# Patient Record
Sex: Female | Born: 1945 | Marital: Married
Health system: Midwestern US, Community
[De-identification: ages and names within clinical notes are randomized; demographics above are authoritative.]

---

## 2012-02-06 IMAGING — DX KNEE 3 VIEWS RIGHT
1 series · 3 of 3 positions shown · non-contrast
Comparison: none

RIGHT KNEE, 02/06/12:
CLINICAL HISTORY: Pain.

[Series 1: pa axial · U · 0.14mm/px · 3 of 3 slices shown]
[im 1/3]
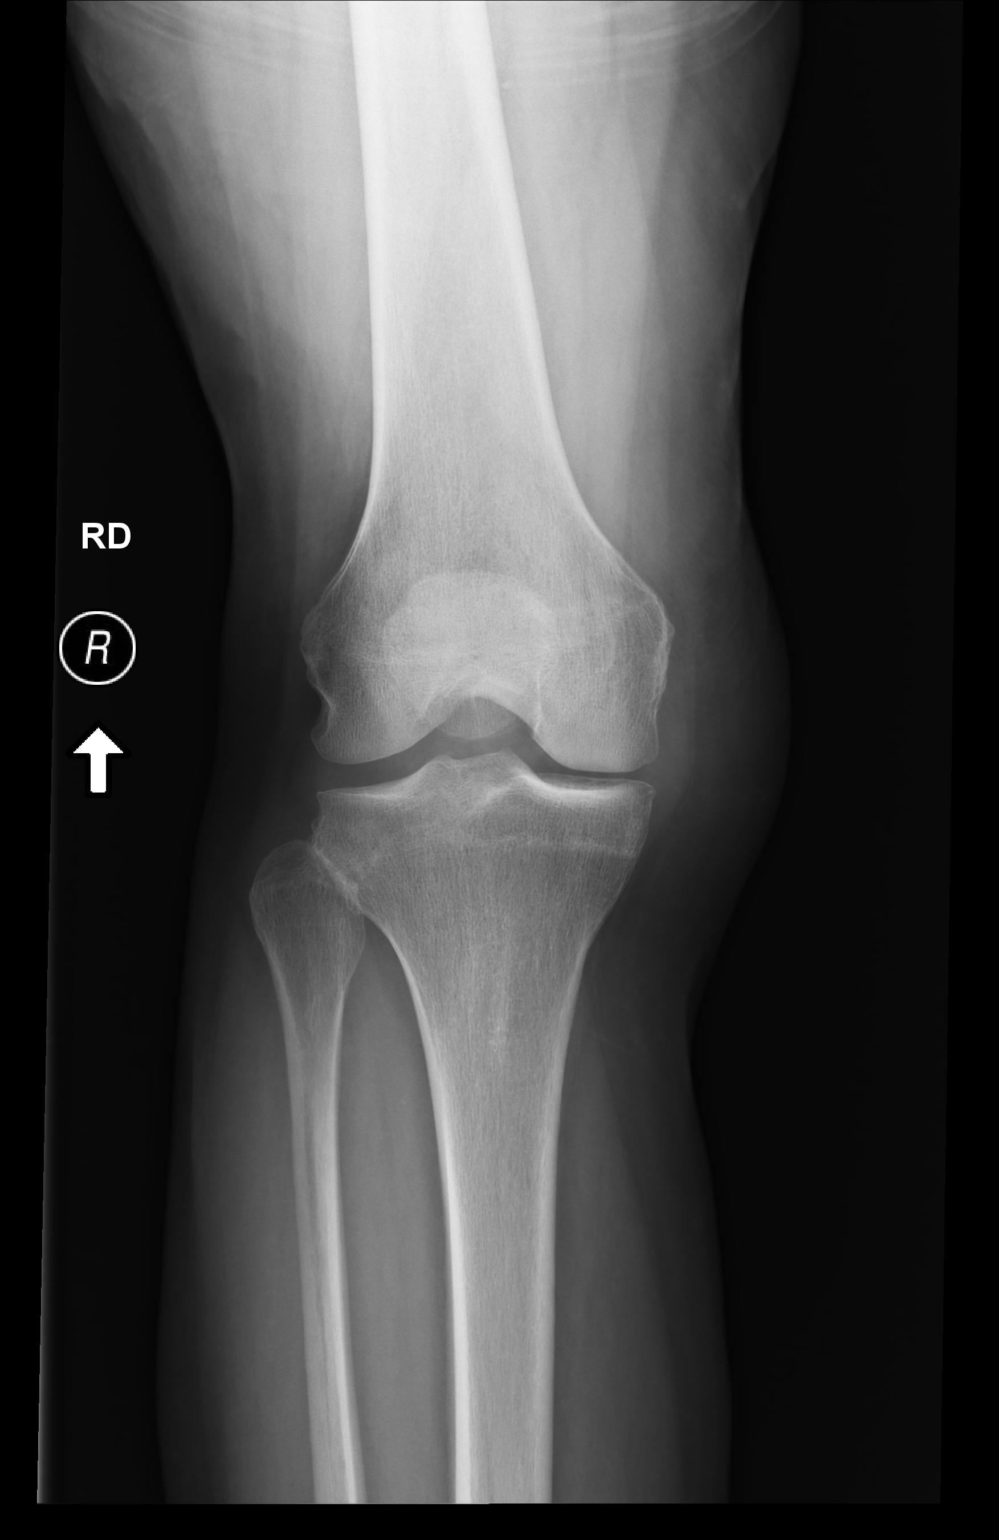
[im 2/3]
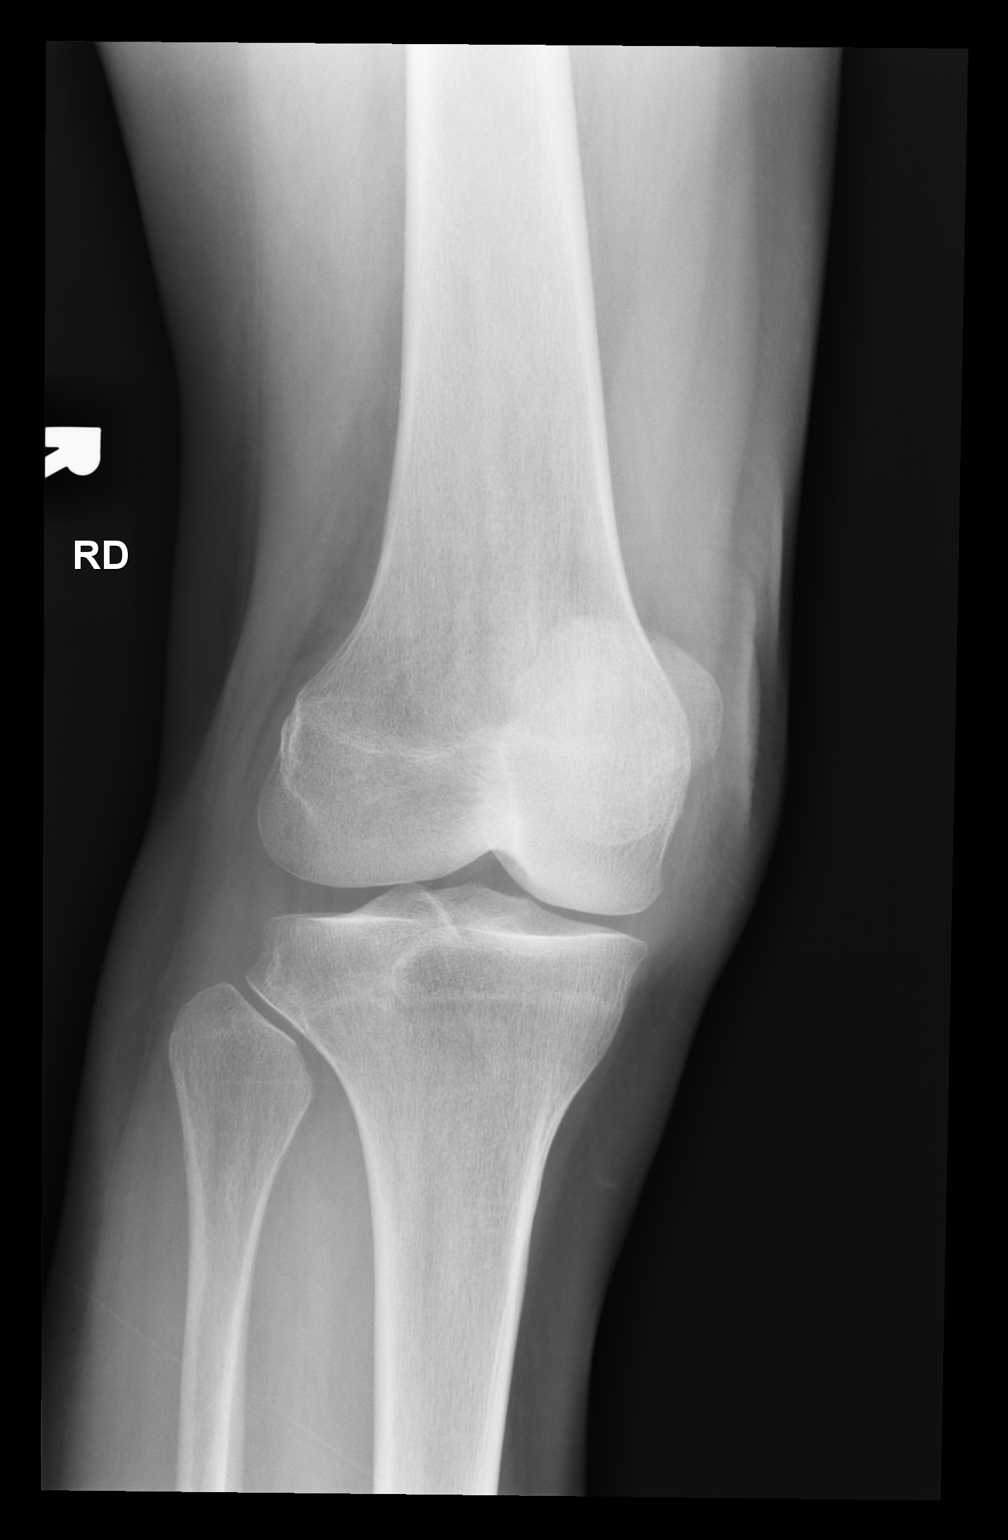
[im 3/3]
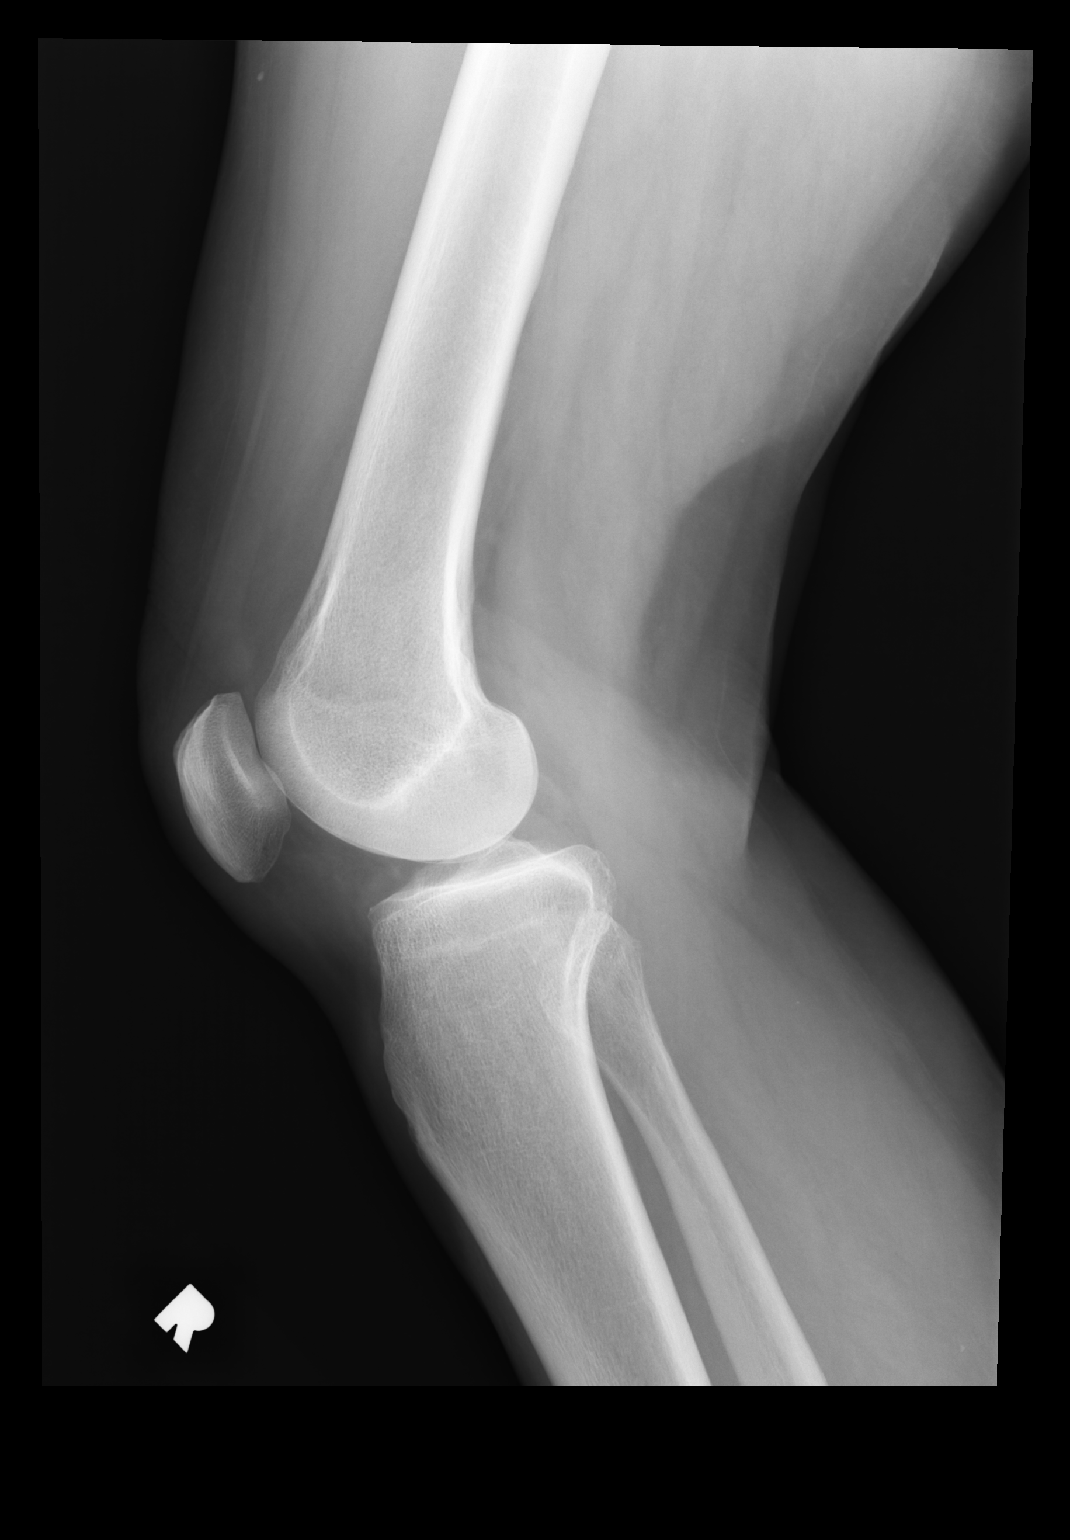

[3 of 3 positions shown; findings below may reference images not displayed]

FINDINGS: Three views of the right knee were obtained including
weightbearing AP film of the right knee.  Mild narrowing of the medial
joint space from minimal degenerative arthritis is present.  There are no
erosions or chondrocalcinosis.  There is no joint effusion.  Minor spurring
of the tibial spine is present.
CONCLUSION: Minimal degenerative arthritis in the right knee.

## 2013-01-20 IMAGING — DX KNEE 4 VIEWS LEFT
4 series · 4 of 4 positions shown · non-contrast
Comparison: none

KNEE 4 VIEWS LEFT, 01/20/2013 [DATE]:
HISTORY: Left knee pain and swelling for a couple of months.

[ap int rot]
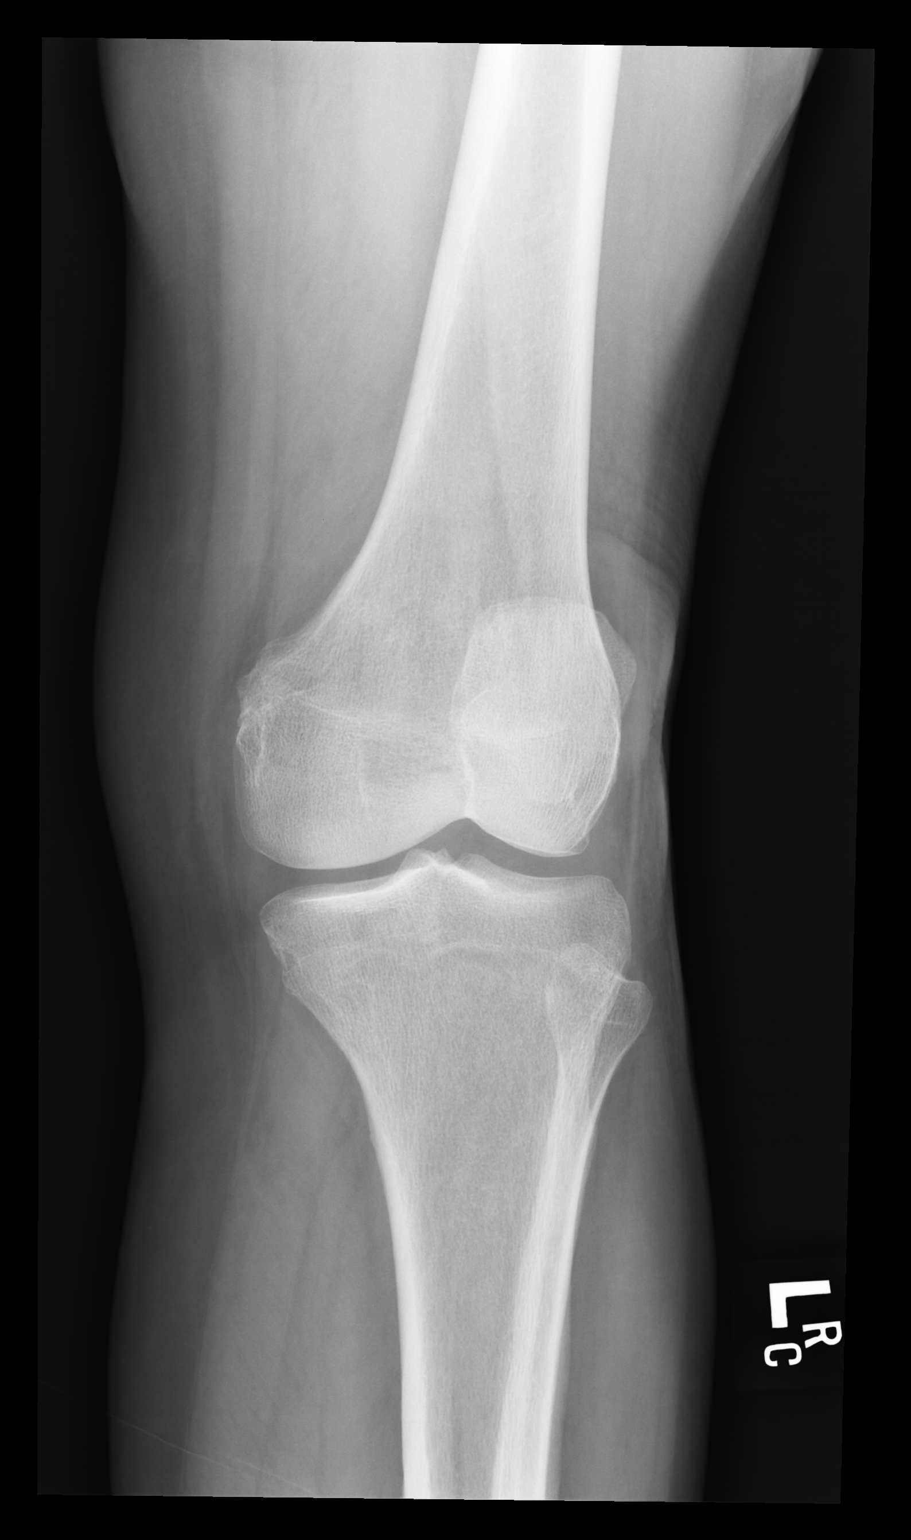

[ap ext rot]
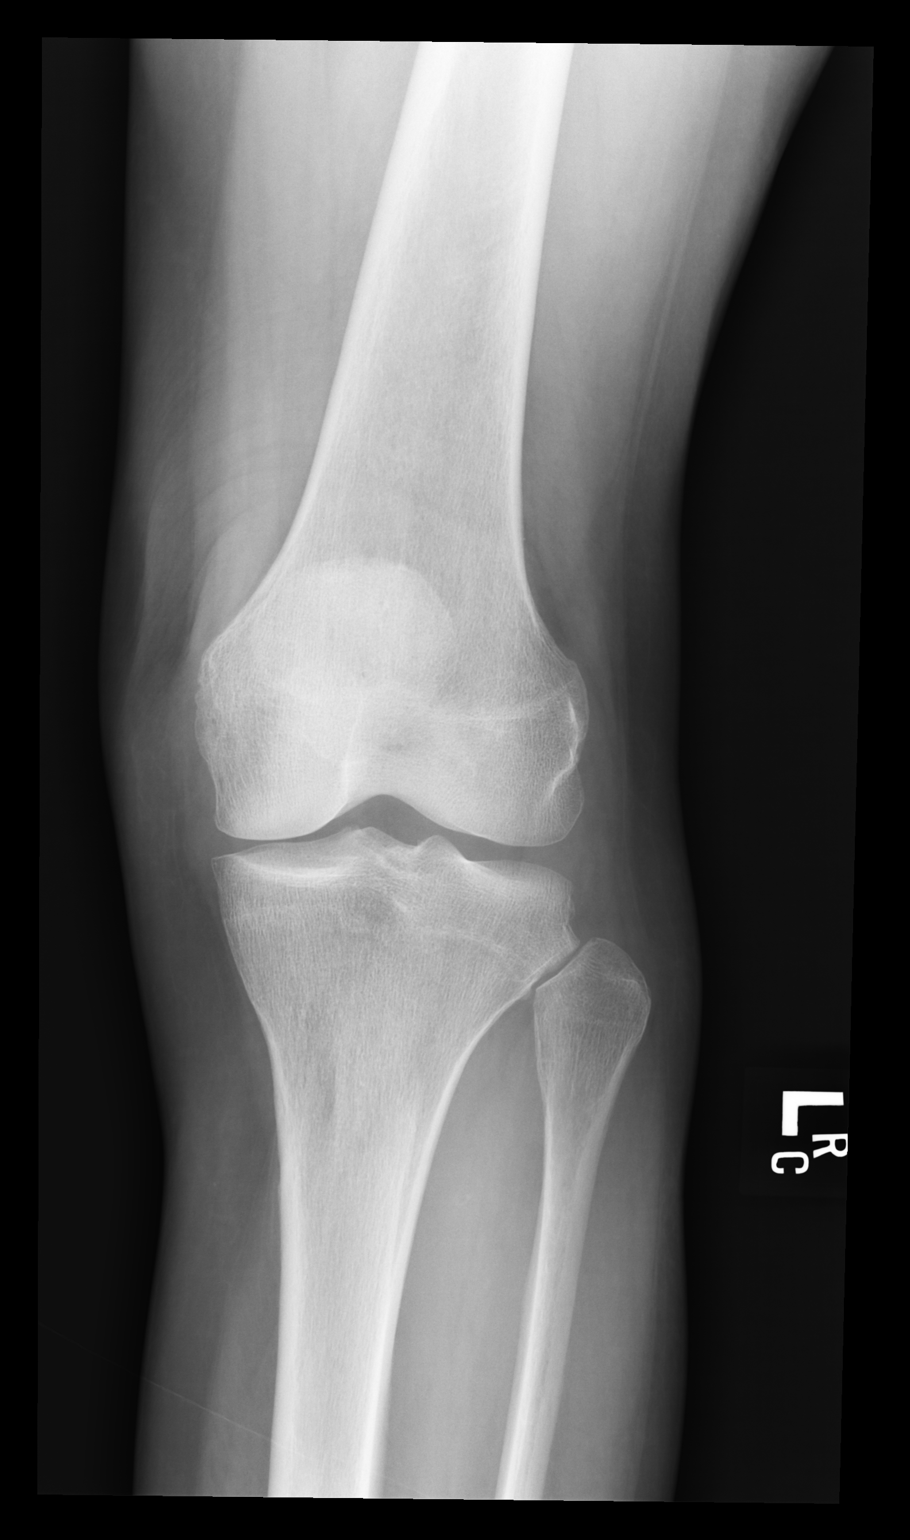

[AP]
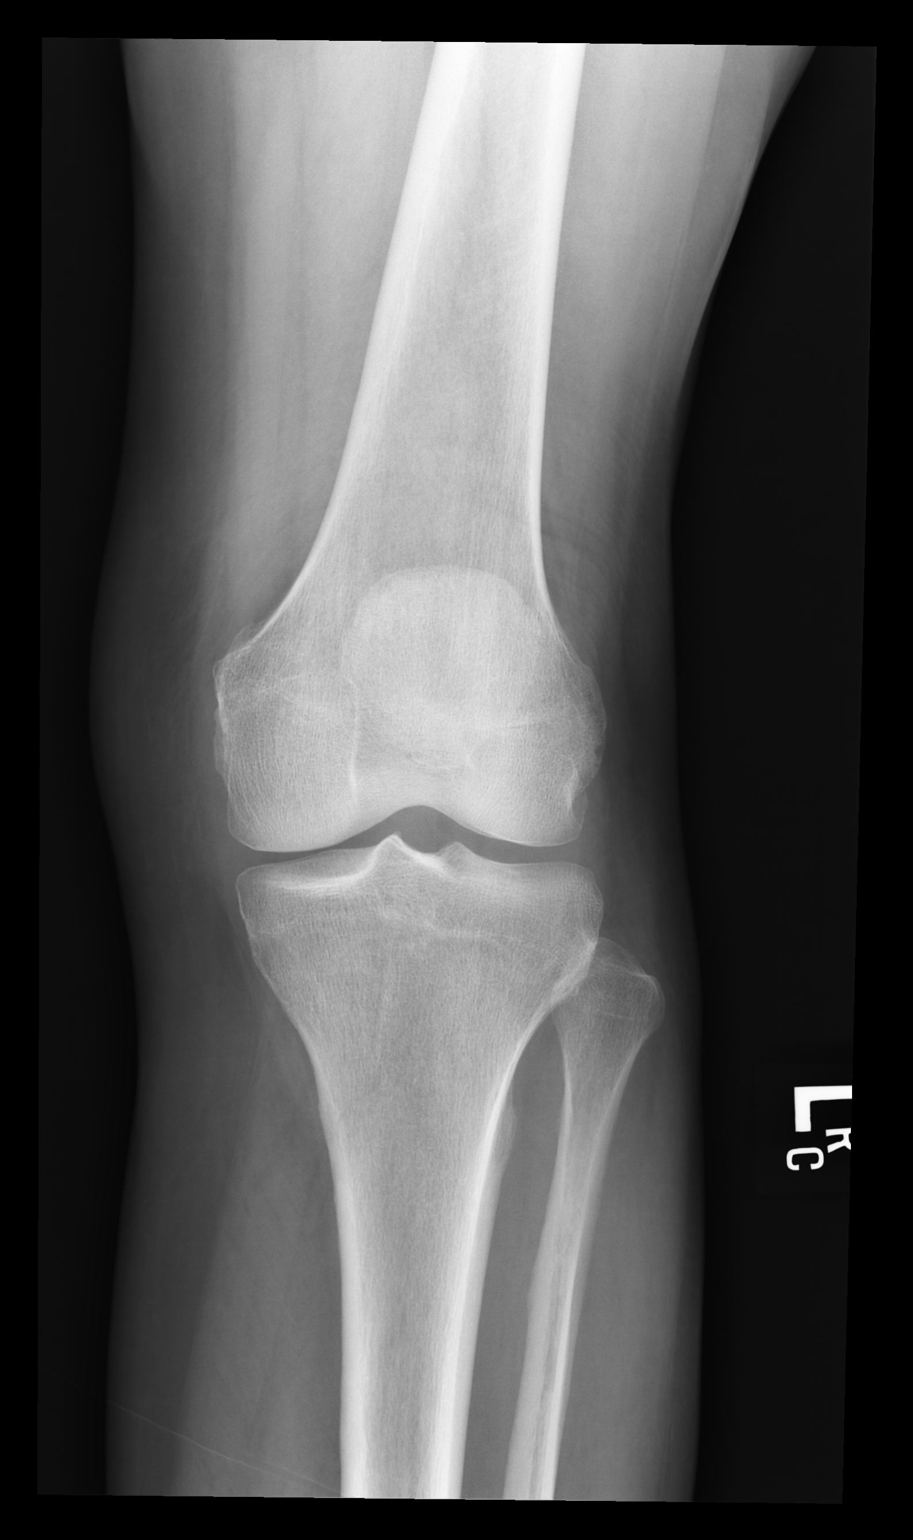

[lateral]
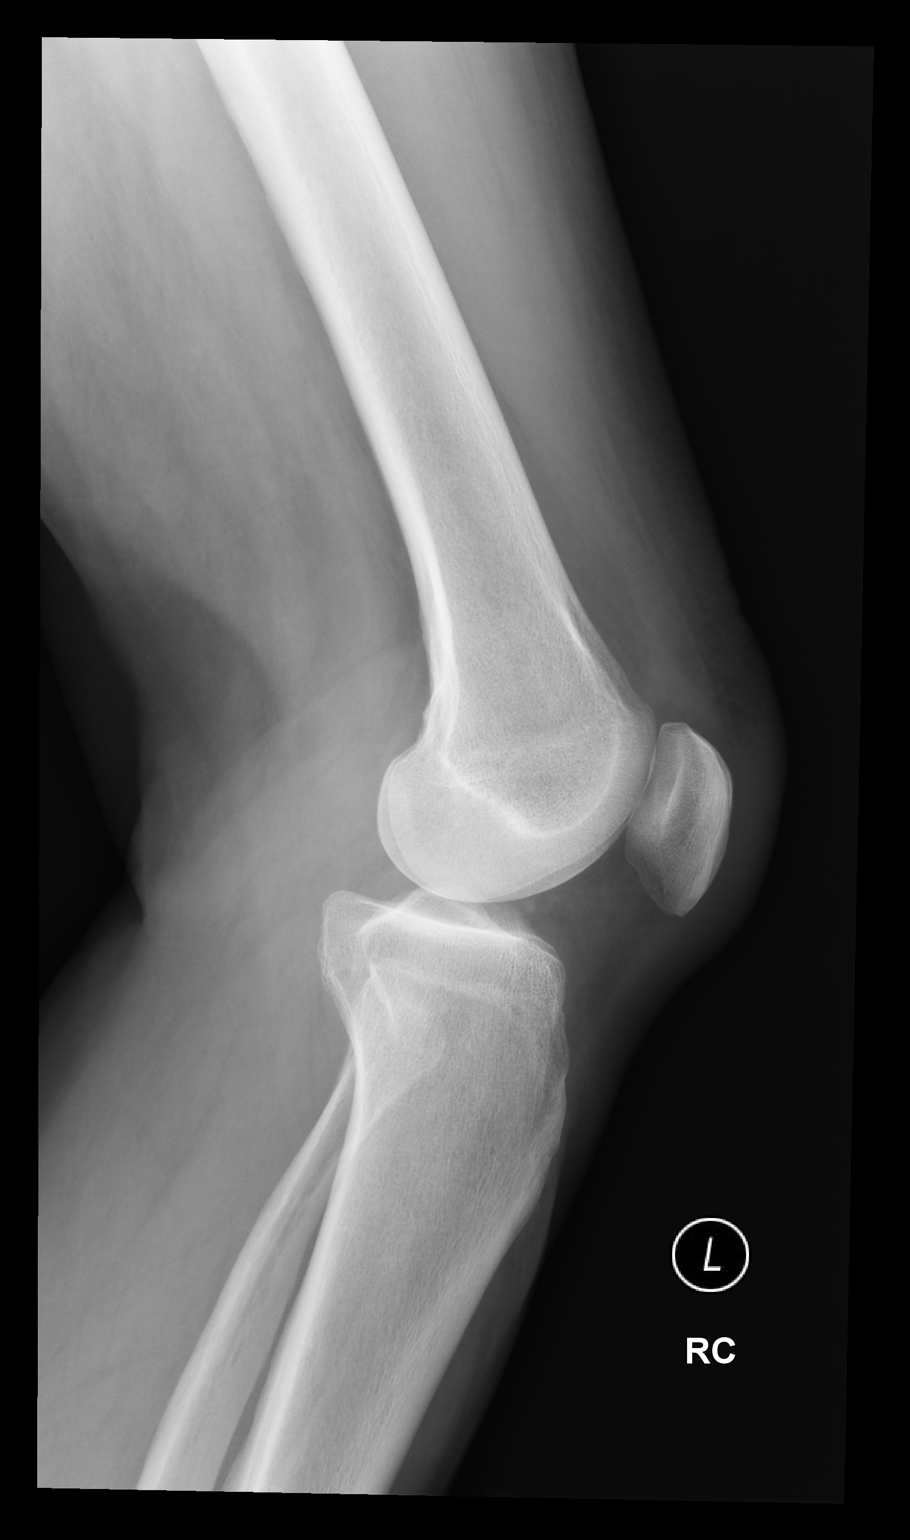

[4 of 4 positions shown; findings below may reference images not displayed]

FINDINGS: Trace suprapatellar effusion in the left knee. There is no evidence
of fracture or dislocation.  No soft tissue abnormalities.
IMPRESSION: Trace suprapatellar effusion the left knee with no fracture or dislocation
seen.
The medial and lateral compartments of the left knee are preserved.

## 2021-11-11 ENCOUNTER — Ambulatory Visit
Admit: 2021-11-11 | Discharge: 2021-11-11 | Payer: MEDICARE | Attending: Student in an Organized Health Care Education/Training Program

## 2021-11-11 DIAGNOSIS — J209 Acute bronchitis, unspecified: Secondary | ICD-10-CM

## 2021-11-11 LAB — POC COVID-19 & INFLUENZA COMBO (LIAT IN HOUSE)
INFLUENZA A: NOT DETECTED
INFLUENZA B: NOT DETECTED
SARS-CoV-2: NOT DETECTED

## 2021-11-11 MED ORDER — ALBUTEROL SULFATE HFA 108 (90 BASE) MCG/ACT IN AERS
108 (90 Base) MCG/ACT | RESPIRATORY_TRACT | 0 refills | Status: AC | PRN
Start: 2021-11-11 — End: ?

## 2021-11-11 MED ORDER — PREDNISONE 20 MG PO TABS
20 MG | ORAL_TABLET | Freq: Every day | ORAL | 0 refills | Status: AC
Start: 2021-11-11 — End: 2021-11-16

## 2021-11-11 MED ORDER — BENZONATATE 100 MG PO CAPS
100 MG | ORAL_CAPSULE | Freq: Three times a day (TID) | ORAL | 0 refills | Status: AC | PRN
Start: 2021-11-11 — End: 2021-11-18

## 2021-11-11 NOTE — Patient Instructions (Signed)
Patient made aware of negative COVID and flu results. We discussed s/s are consistent with acute bronchitis secondary to viral URI and antibiotics are not indicated at this time. We discussed viruses are self-limiting and treatment is focused on symptom management. Advised will prescribe steroid, prednisone, to take once a day for 5 days for bronchitis. Advised will prescribe benzonatate to take 3 times a day as needed for cough. Advised will prescribe albuterol inhaler to use every 4 hours as needed for shortness of breath and/or wheezing. Advised may take OTC plain mucinex as directed for congestion. We discussed importance of adequate hydration. Advised to follow-up at an urgent care in [place] if symptoms worsen or do not improve with prednisone.

## 2021-11-11 NOTE — Progress Notes (Signed)
Tamara Kennedy (DOB:  1945/04/25) is a 76 y.o. female,here for evaluation of the following chief complaint(s):  Cough          ASSESSMENT/PLAN:  1. Acute bronchitis, unspecified organism  -     predniSONE (DELTASONE) 20 MG tablet; Take 2 tablets by mouth daily for 5 days, Disp-10 tablet, R-0Normal  -     albuterol sulfate HFA (VENTOLIN HFA) 108 (90 Base) MCG/ACT inhaler; Inhale 2 puffs into the lungs every 4 hours as needed for Wheezing or Shortness of Breath, Disp-1 each, R-0Normal  2. Acute cough  -     POC COVID-19 & Influenza Combo ([facility] in House)  -     benzonatate (TESSALON) 100 MG capsule; Take 1 capsule by mouth 3 times daily as needed for Cough, Disp-21 capsule, R-0Normal  3. Viral upper respiratory tract infection      Patient made aware of negative COVID and flu results. We discussed s/s are consistent with acute bronchitis secondary to viral URI and antibiotics are not indicated at this time. We discussed viruses are self-limiting and treatment is focused on symptom management. Advised will prescribe steroid, prednisone, to take once a day for 5 days for bronchitis. Advised will prescribe benzonatate to take 3 times a day as needed for cough. Advised will prescribe albuterol inhaler to use every 4 hours as needed for shortness of breath and/or wheezing. Advised may take OTC plain mucinex as directed for congestion. We discussed importance of adequate hydration. Advised to follow-up at an urgent care in [place] if symptoms worsen or do not improve with prednisone.         HPI:  Patient traveling to [place] from [place] presents with c/o congestion, productive cough x 3 days. Denies sick contacts. Denies history of asthma, COPD or other respiratory problems. Denies fever, body aches, chills, fatigue, malaise, N/V/D, sore throat, shortness of breath, wheezing, rash, loss of taste or smell and any other symptoms.              Review of Systems   HENT:  Positive for congestion.    Respiratory:  Positive  for cough.    All other systems reviewed and are negative.        VITALS:  Vitals:    11/11/21 1248   BP: 129/76   Site: Right Upper Arm   Position: Sitting   Cuff Size: Large Adult   Pulse: 85   Resp: 16   Temp: 98.4 F (36.9 C)   TempSrc: Oral   SpO2: 97%   Weight: 80.5 kg (177 lb 8 oz)           MEDICATIONS:  Current Outpatient Medications   Medication Sig Dispense Refill    losartan-hydroCHLOROthiazide (HYZAAR) 100-12.5 MG per tablet       verapamil (CALAN SR) 180 MG extended release tablet       atorvastatin (LIPITOR) 40 MG tablet       escitalopram (LEXAPRO) 10 MG tablet Take by mouth      aspirin 81 MG EC tablet Take by mouth      Calcium Carbonate-Vit D-Min (CALCIUM 1200) 1200-1000 MG-UNIT CHEW Take by mouth      amLODIPine-atorvastatatin (CADUET) 10-20 MG per tablet Take 1 tablet by mouth daily      predniSONE (DELTASONE) 20 MG tablet Take 2 tablets by mouth daily for 5 days 10 tablet 0    benzonatate (TESSALON) 100 MG capsule Take 1 capsule by mouth 3 times daily as needed for Cough 21  capsule 0    albuterol sulfate HFA (VENTOLIN HFA) 108 (90 Base) MCG/ACT inhaler Inhale 2 puffs into the lungs every 4 hours as needed for Wheezing or Shortness of Breath 1 each 0     No current facility-administered medications for this visit.               There is no problem list on file for this patient.      No past surgical history on file.         ALLERGIES:  Allergies   Allergen Reactions    Clindamycin Rash    Hydrocodone Hives    Tetracycline      Other reaction(s): Phototoxic drug reaction    Codeine             Physical Exam  Vitals reviewed.   Constitutional:       General: She is awake. She is not in acute distress.     Appearance: Normal appearance. She is well-developed.   HENT:      Mouth/Throat:      Lips: Pink.      Mouth: Mucous membranes are moist.   Cardiovascular:      Rate and Rhythm: Normal rate and regular rhythm.      Heart sounds: Normal heart sounds, S1 normal and S2 normal. No murmur heard.      No friction rub. No gallop.   Pulmonary:      Effort: Pulmonary effort is normal. No respiratory distress.      Breath sounds: Normal breath sounds and air entry.   Skin:     General: Skin is warm and dry.      Capillary Refill: Capillary refill takes less than 2 seconds.      Findings: No lesion or rash.   Neurological:      Mental Status: She is alert and oriented to person, place, and time.   Psychiatric:         Behavior: Behavior is cooperative.               An electronic signature was used to authenticate this note.    --Ilean China, DNP, FNP-C

## 2021-12-07 IMAGING — MG MAMMOGRAPHY SCREENING BILATERAL 3[PERSON_NAME]
8 series · 8 of 24 positions shown · non-contrast
Comparison: Comparison was made to prior examinations.

________________________________________________________________________________________________ 
MAMMOGRAPHY SCREENING BILATERAL 3JUNRIC HOMERES, 12/07/2021 [DATE]: 
CLINICAL INDICATION: Encounter for screening mammogram.
TECHNIQUE: Digital bilateral mammograms and 3-D Tomosynthesis were obtained. 
These were interpreted both primarily and with the aid of computer-aided 
detection system.  
BREAST DENSITY: (Level B) There are scattered areas of fibroglandular density.

[R MLO]
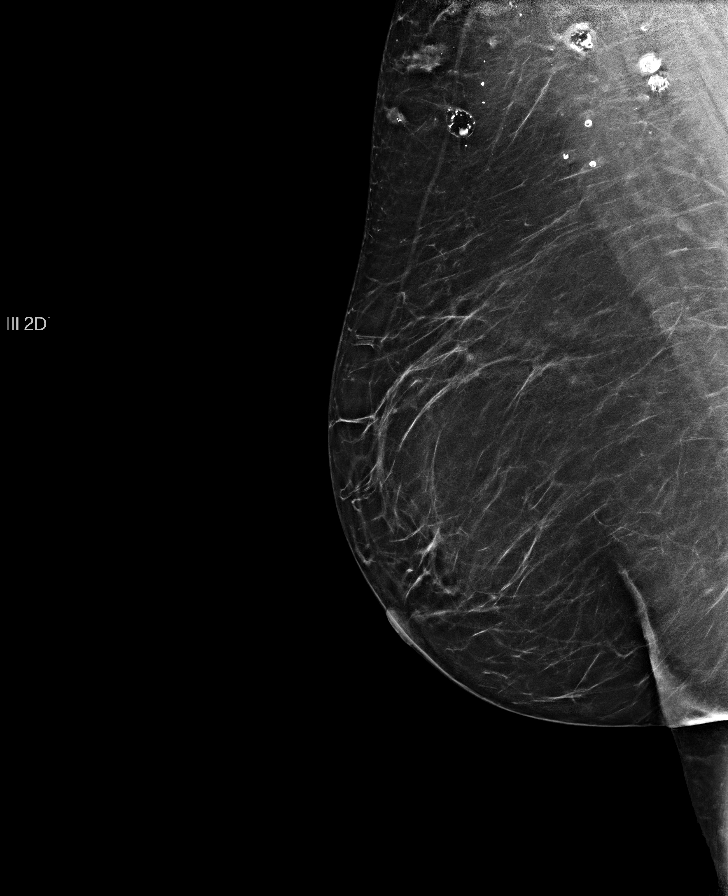

[L MLO]
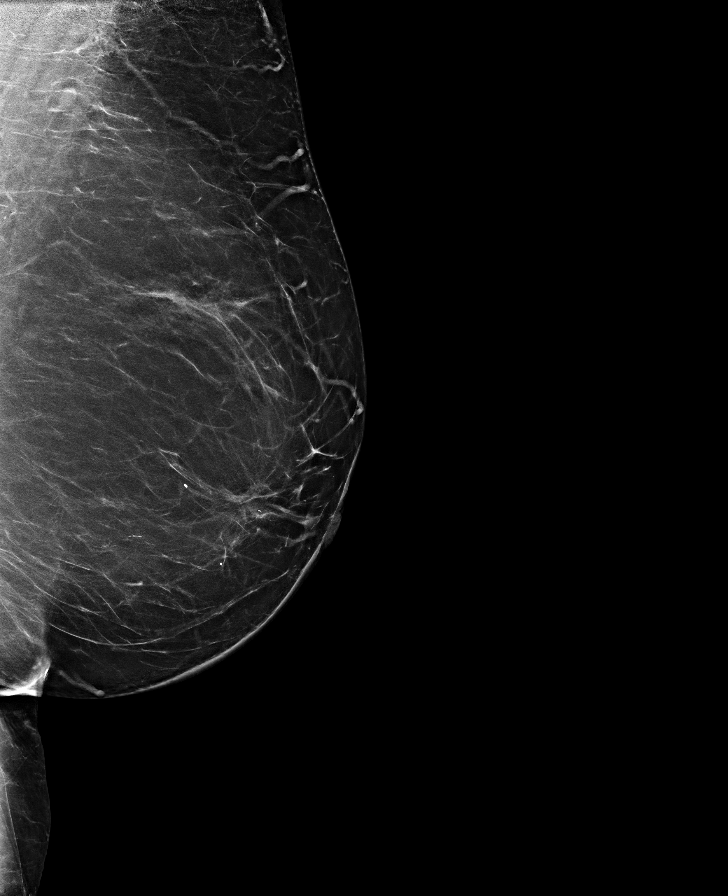

[L CC]
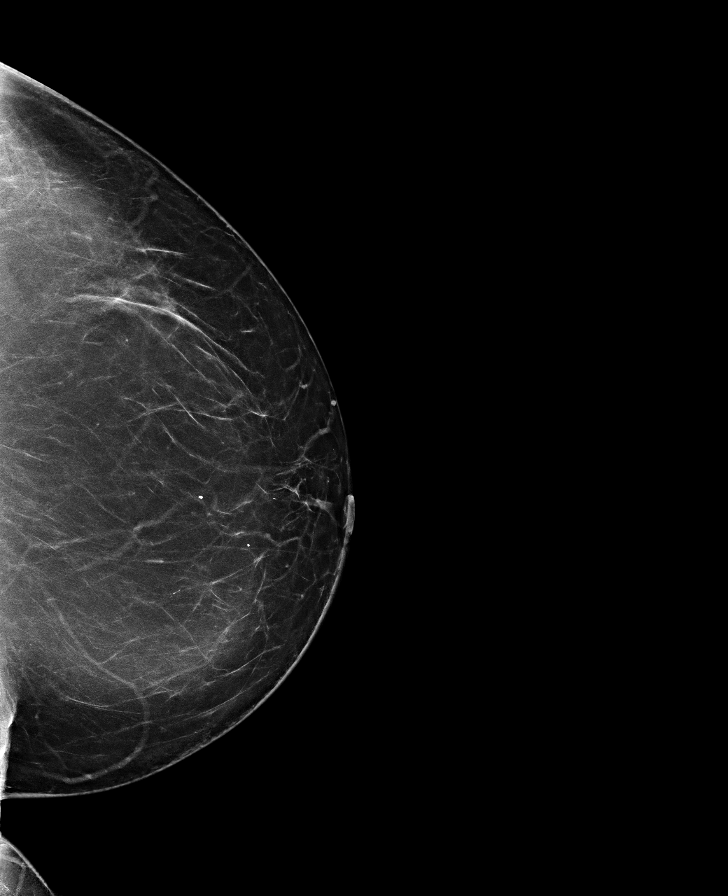

[R CC]
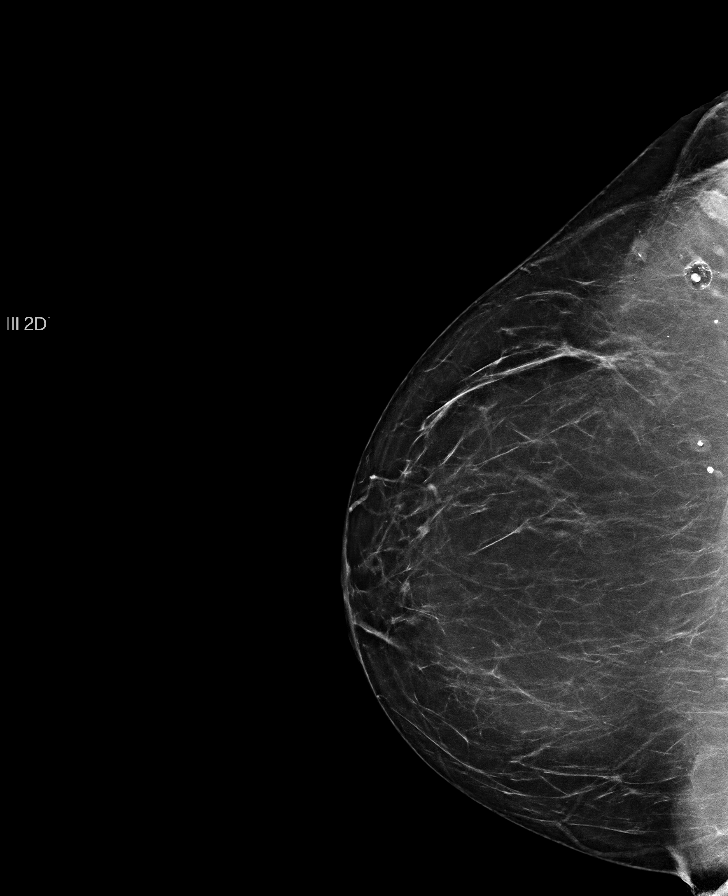

[L CC tomo · tomo slice 47/92.0]
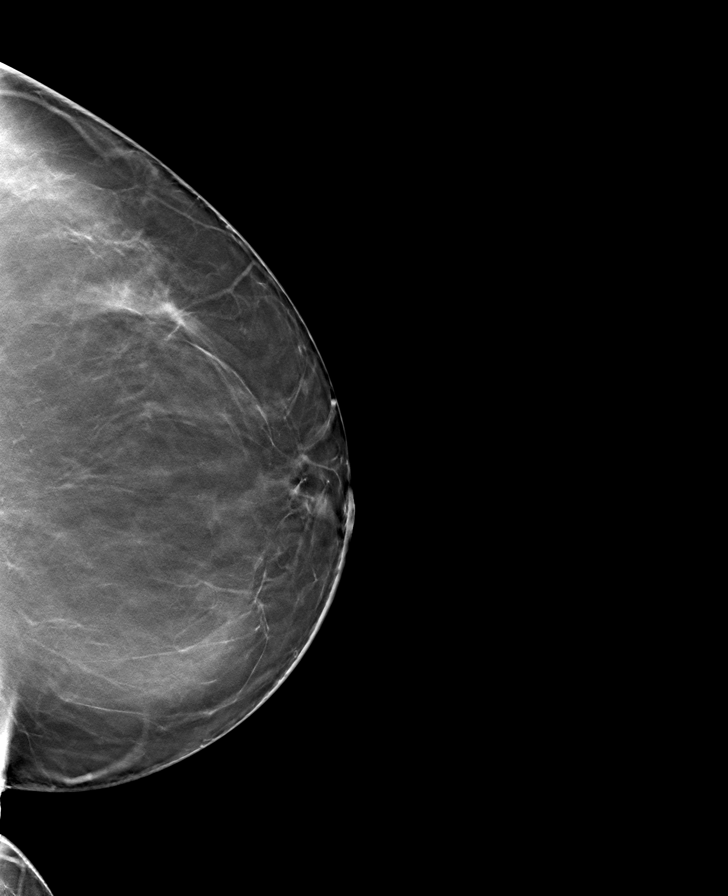

[R CC tomo · tomo slice 45/90.0]
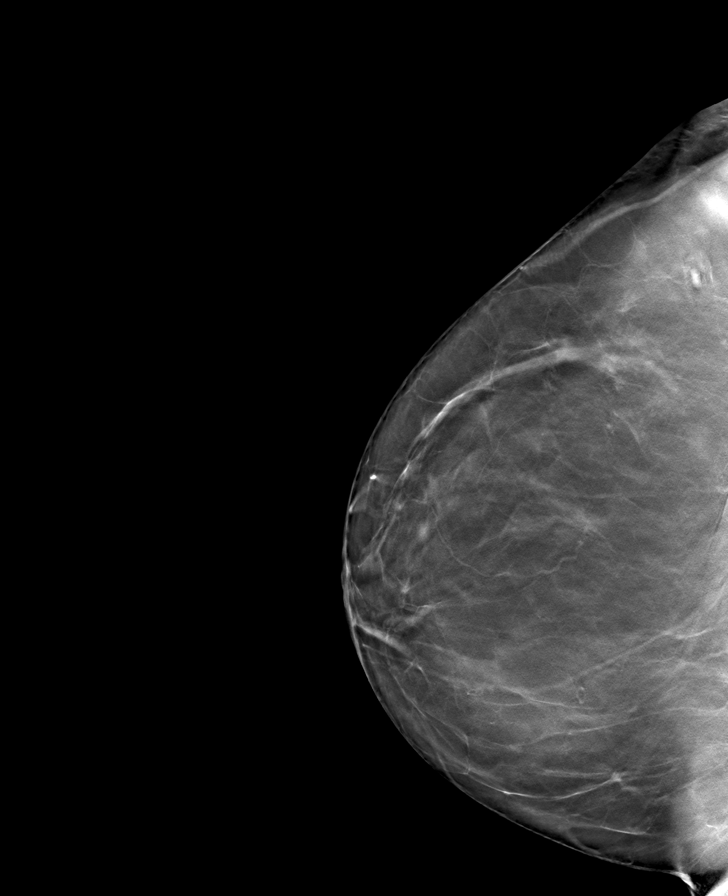

[L MLO tomo · tomo slice 46/91.0]
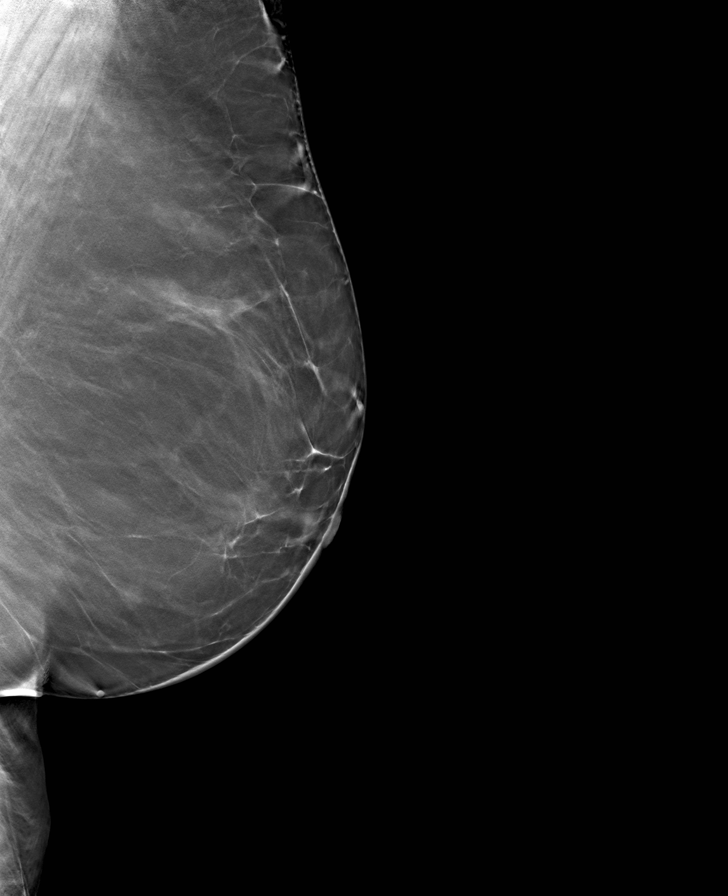

[R MLO tomo · tomo slice 45/90.0]
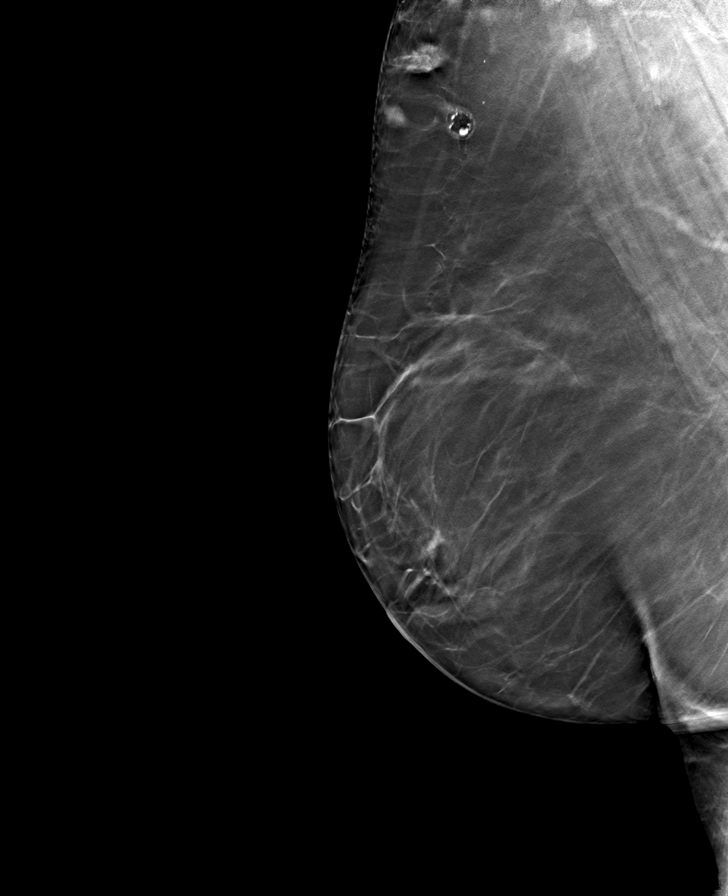

[8 of 24 positions shown; findings below may reference images not displayed]

FINDINGS: Stable calcified injection granulomas in the right breast and right 
axillary region. Possible developing architectural distortion in the 
retroareolar region of the left breast seen only the MLO projection will bring 
the patient for spot compression and rolled views of the left breast in MLO 
projection and possible limited left breast ultrasound for further evaluation. 
No dominant mass in the right breast and no suspicious clusters of 
microcalcifications in either breast.
IMPRESSION: Possible developing architectural distortion in the retroareolar region of the 
left breast seen only the MLO projection will bring the patient for spot 
compression and rolled views of the left breast in MLO projection and possible 
limited left breast ultrasound for further evaluation.  
(BI-RADS 0) Incomplete. Further evaluation will be performed as discussed above 
and the results will be reported separately.

## 2021-12-23 IMAGING — MG MAMMOGRAPHY DIAGNOSTIC LEFT 3D TOMOSYNTH
8 series · 8 of 24 positions shown · non-contrast
Comparison: Comparison was made to prior examinations.

________________________________________________________________________________________________ 
MAMMOGRAPHY DIAGNOSTIC LEFT 3D TOMOSYNTH, LEFT BREAST ULTRASOUND UNILATERAL 
LIMITED, 12/23/2021 [DATE]: 
CLINICAL INDICATION: Patient with possible developing architectural distortion 
in the retroareolar region of the left breast seen on MLO view only.
TECHNIQUE: Unilateral left breast digital mammography and 3-D Tomosynthesis were 
obtained. In addition, computer-aided detection was utilized. Limited Left 
breast ultrasound demonstrates. 
BREAST DENSITY: (Level B) There are scattered areas of fibroglandular density.

[L ML]
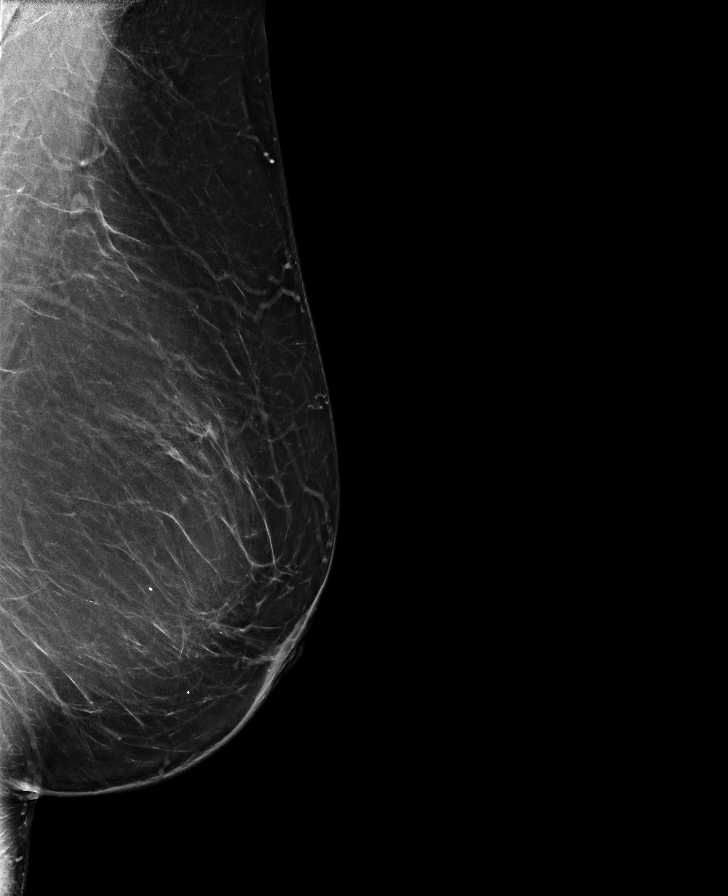

[L MLO (1 of 3)]
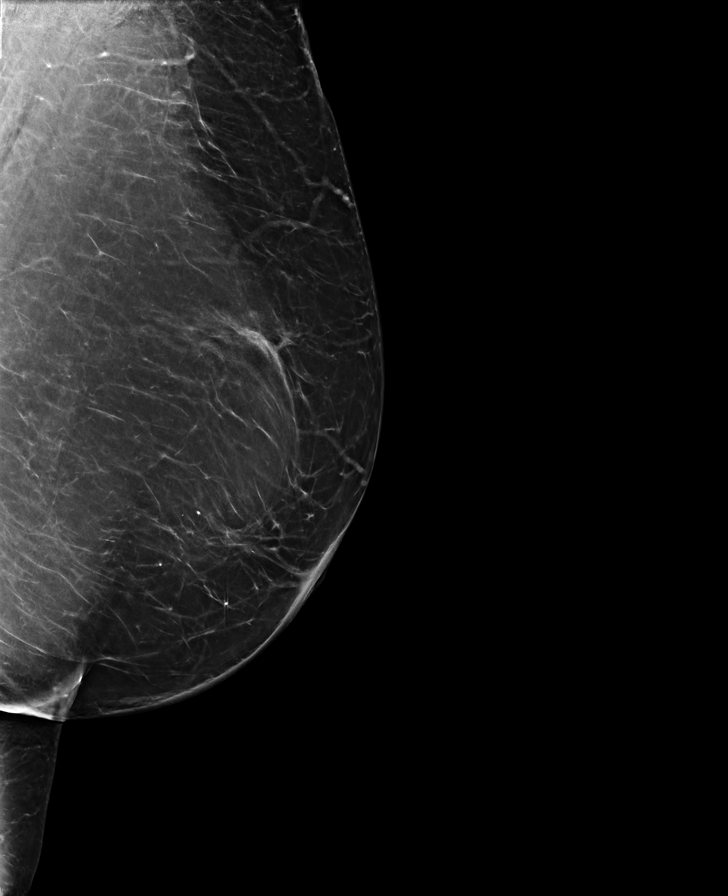

[L MLO (2 of 3)]
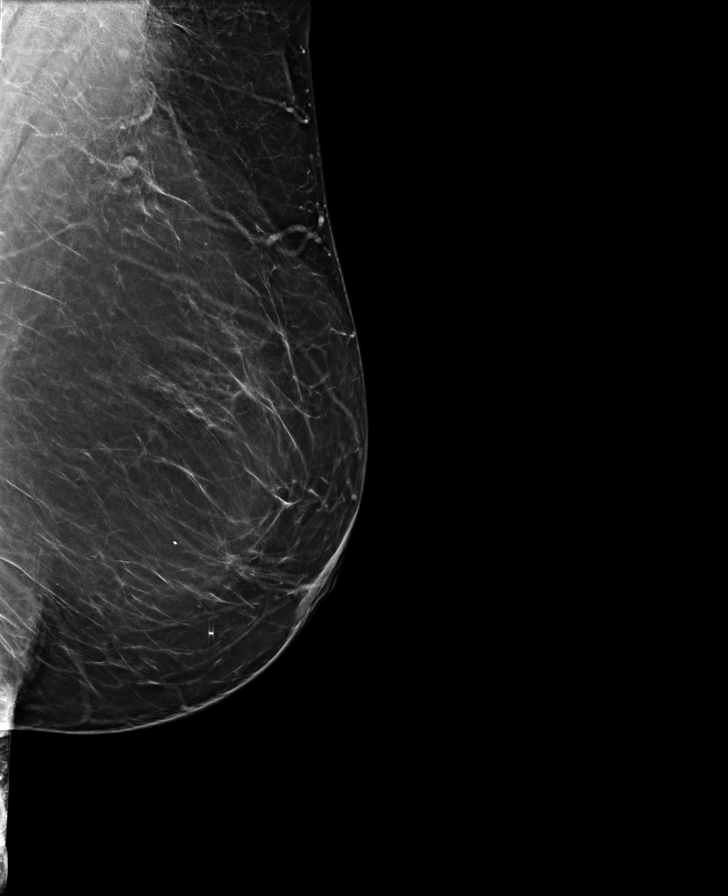

[L MLO (3 of 3)]
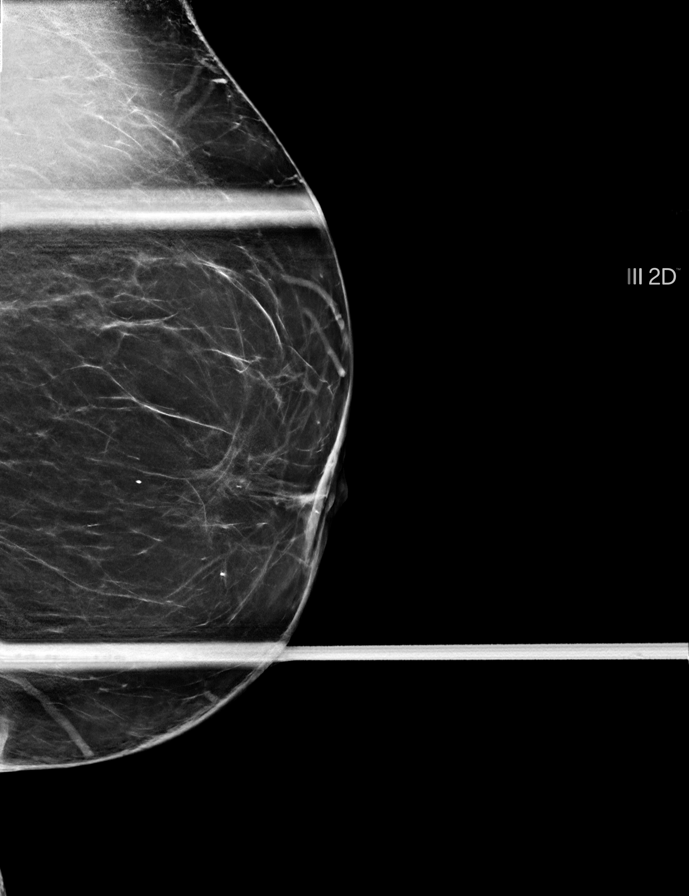

[L MLO tomo (1 of 3) · tomo slice 39/78.0]
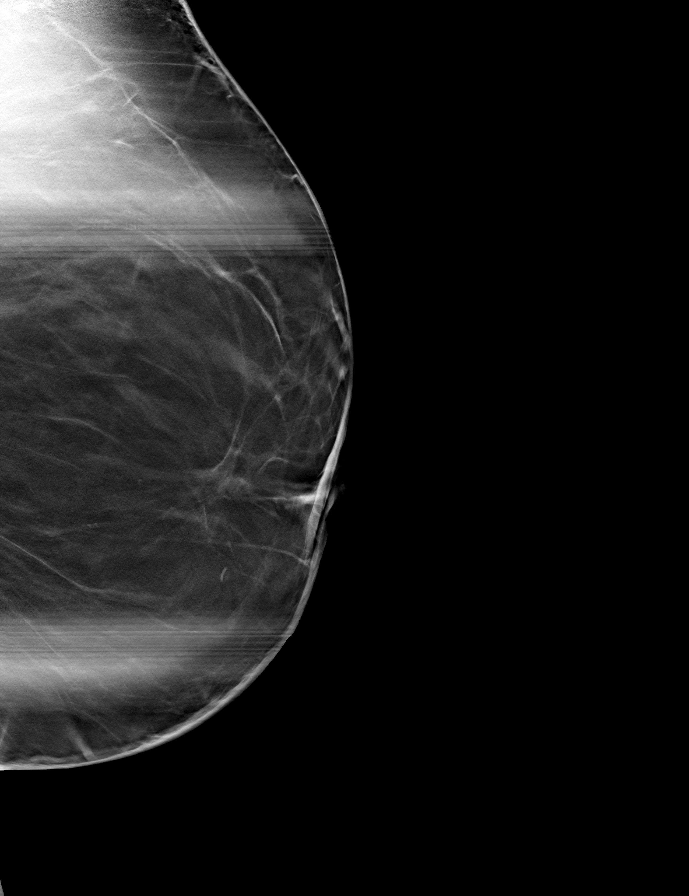

[L MLO tomo (2 of 3) · tomo slice 49/97.0]
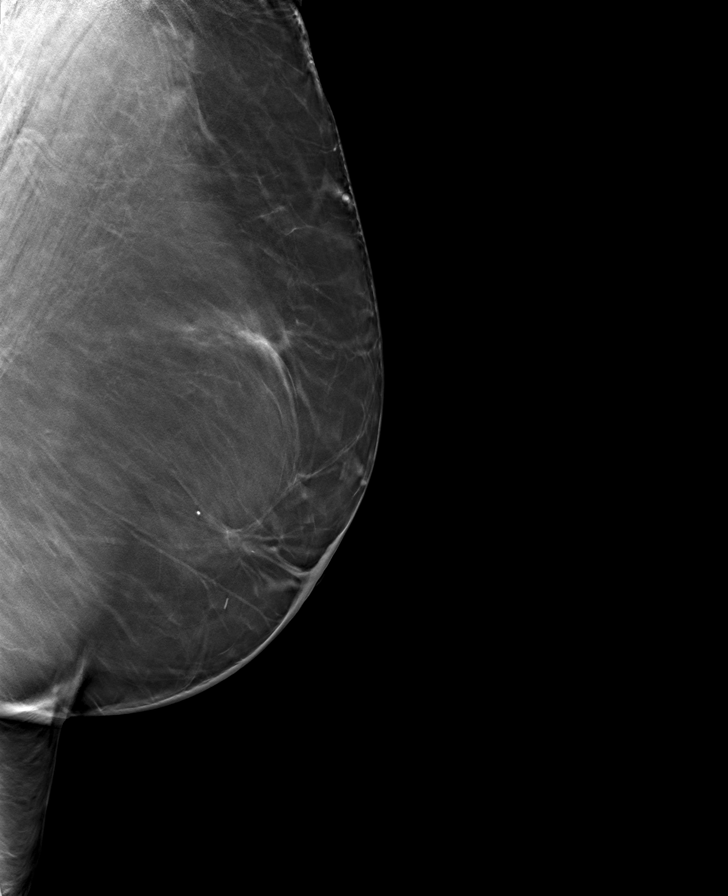

[L MLO tomo (3 of 3) · tomo slice 47/92.0]
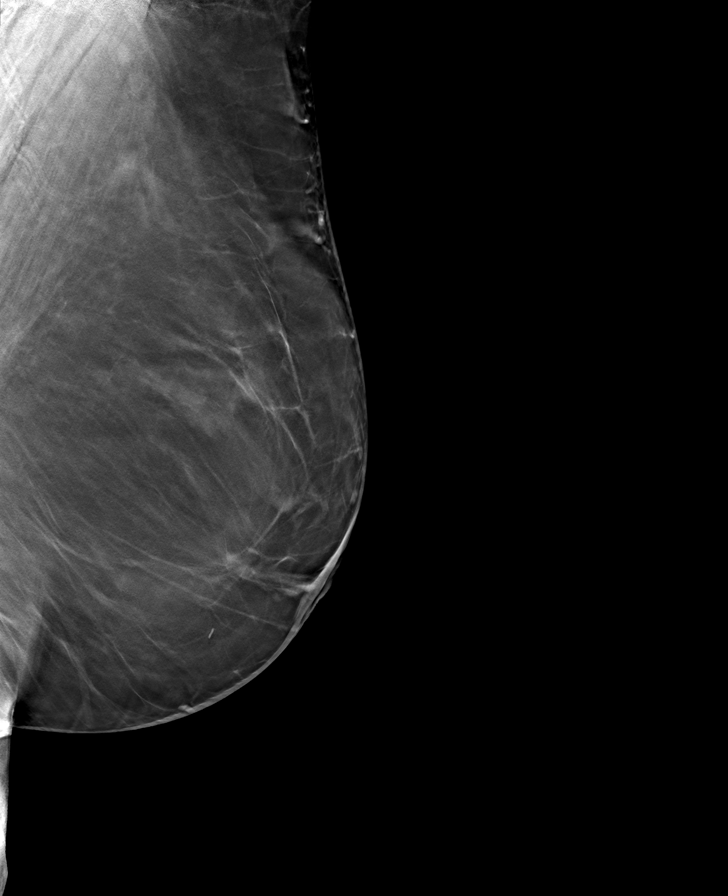

[L ML tomo · tomo slice 49/96.0]
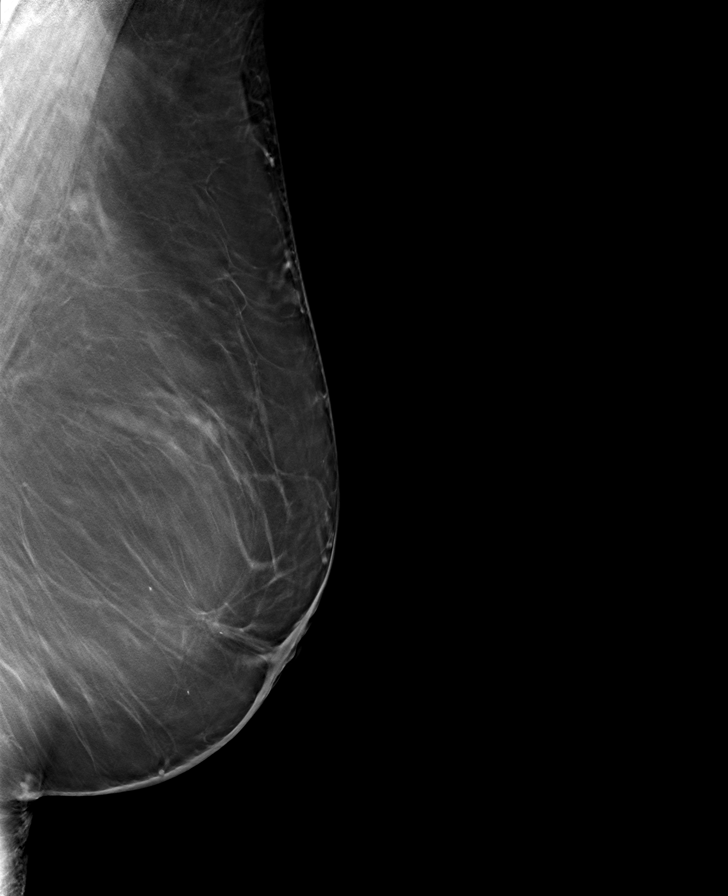

[8 of 24 positions shown; findings below may reference images not displayed]

FINDINGS: Question architectural distortion in the retroareolar region of the 
left breast is resolved on rolled views and less apparent on spot compression 
MLO projection. 
On left breast ultrasound no discrete solid or cystic lesion or area of 
shadowing in the retroareolar region of the left breast. Patient may return to 
routine yearly bilateral screening mammograms.
IMPRESSION: (BI-RADS 2) Benign findings. Routine mammographic follow-up is recommended.

## 2022-01-28 IMAGING — MR MRI LUMBAR SPINE WITHOUT CONTRAST
6 of 8 series · 12 of 48 positions shown · IV contrast (gadolinium)
Comparison: No prior lumbar examination

________________________________________________________________________________________________ 
MRI LUMBAR SPINE WITHOUT CONTRAST, 01/28/2022 [DATE]: 
CLINICAL INDICATION: Spinal stenosis
TECHNIQUE: Sagittal T1, Sagittal T2, Sagittal STIR, Axial T1 and Axial T2 MR 
images of the lumbar spine were performed without intravenous gadolinium 
enhancement.

[Series 101: survey · axial · 10.0mm · 1.25mm/px · z∈[-33,+201]mm · 2 of 10 slices shown]
[im 1/10]
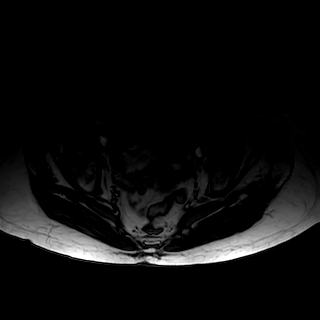
[im 10/10]
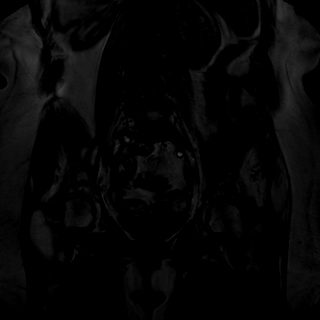

[Series 201: t2w_cor-surv · coronal · 6.0mm · 0.62mm/px · 1 of 10 slices shown]
[im 1/10]
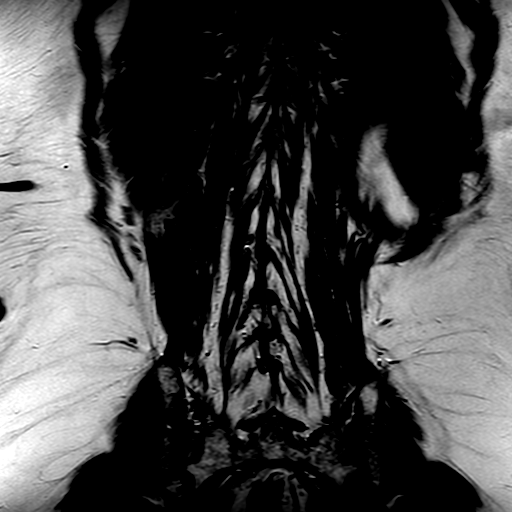

[Series 301: t1_tse_sag · sagittal · 4.0mm · 0.46mm/px · 2 of 17 slices shown]
[im 1/17]
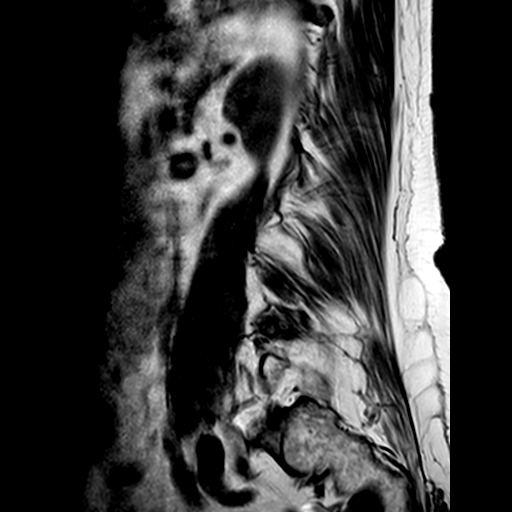
[im 17/17]
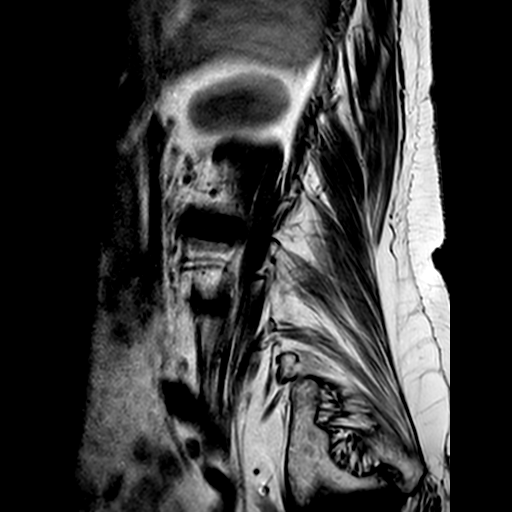

[Series 402: (id)_mdixon_tse · sagittal · 4.0mm · 0.45mm/px · 2 of 17 slices shown]
[im 1/17]
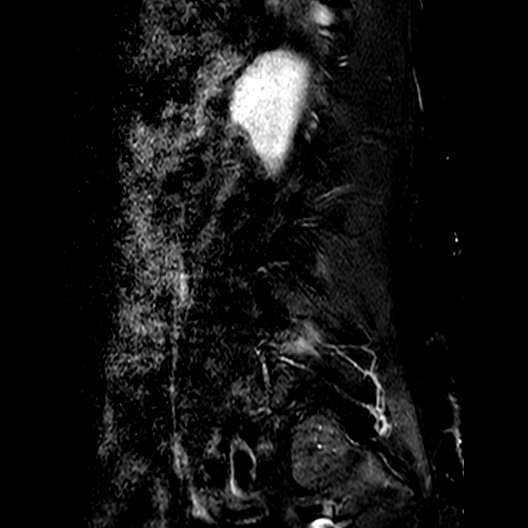
[im 17/17]
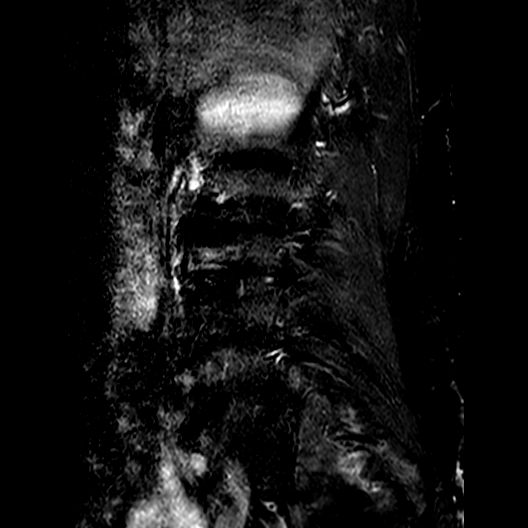

[Series 403: st2w_mdixon_tse · sagittal · 4.0mm · 0.45mm/px · 2 of 17 slices shown]
[im 1/17]
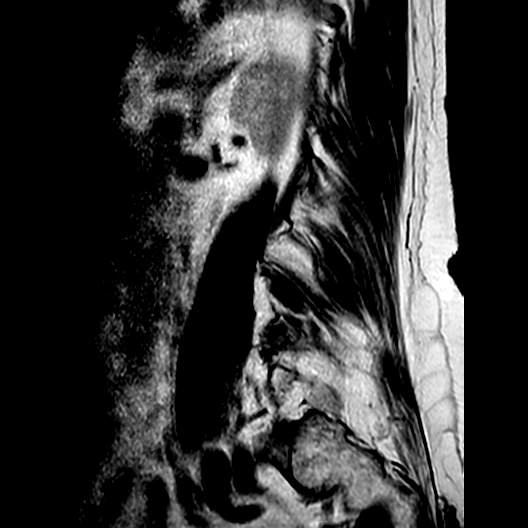
[im 17/17]
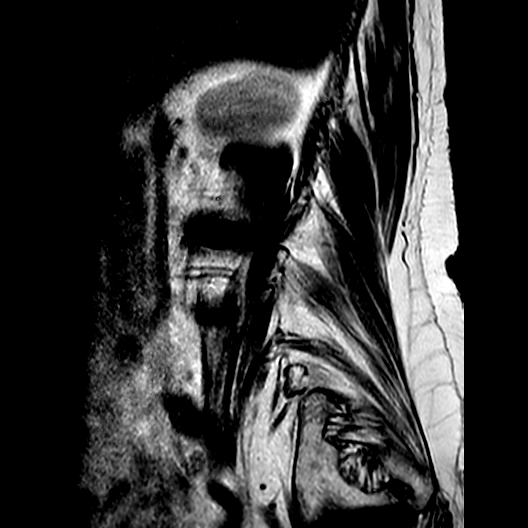

[Series 502: (id) view_ax mpr · axial · 1.0mm · 0.25mm/px · z∈[+32,+95]mm · 3 of 151 slices shown]
[im 8/151]
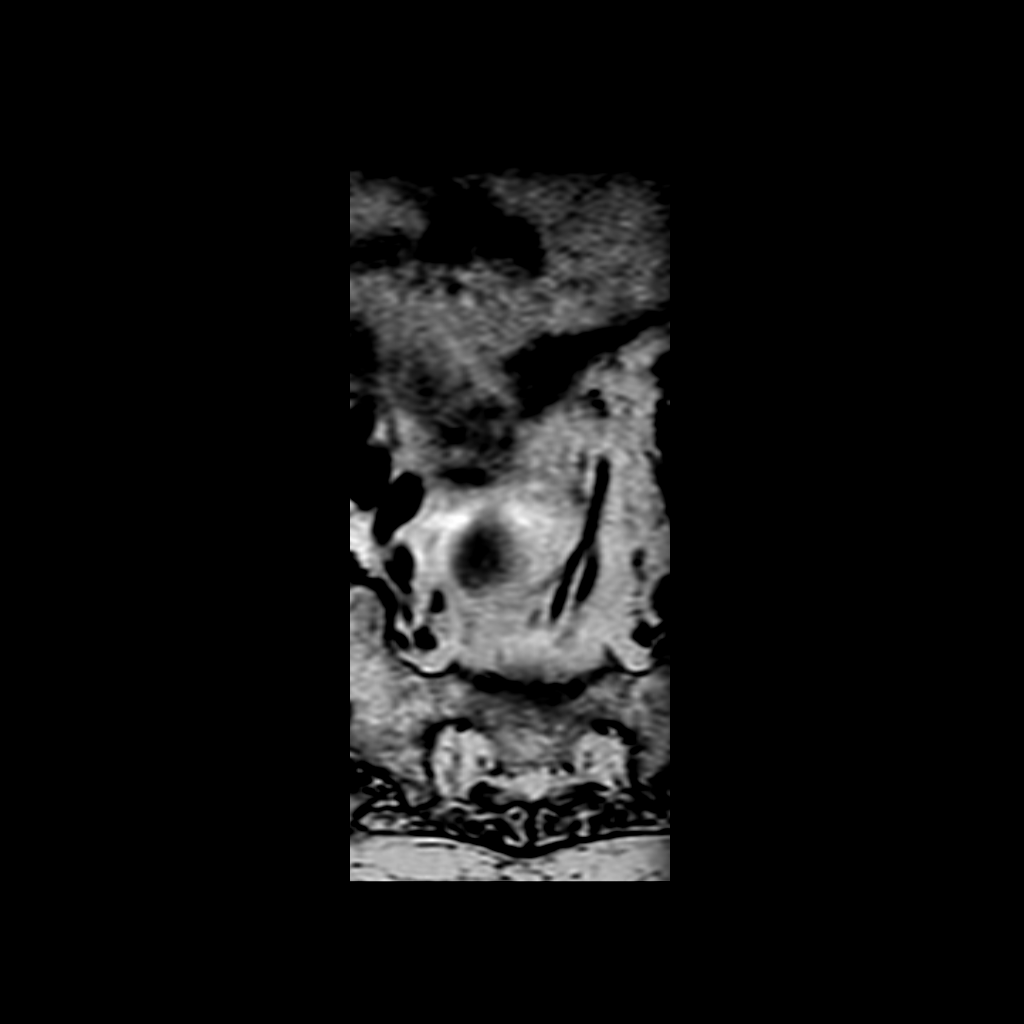
[im 24/151]
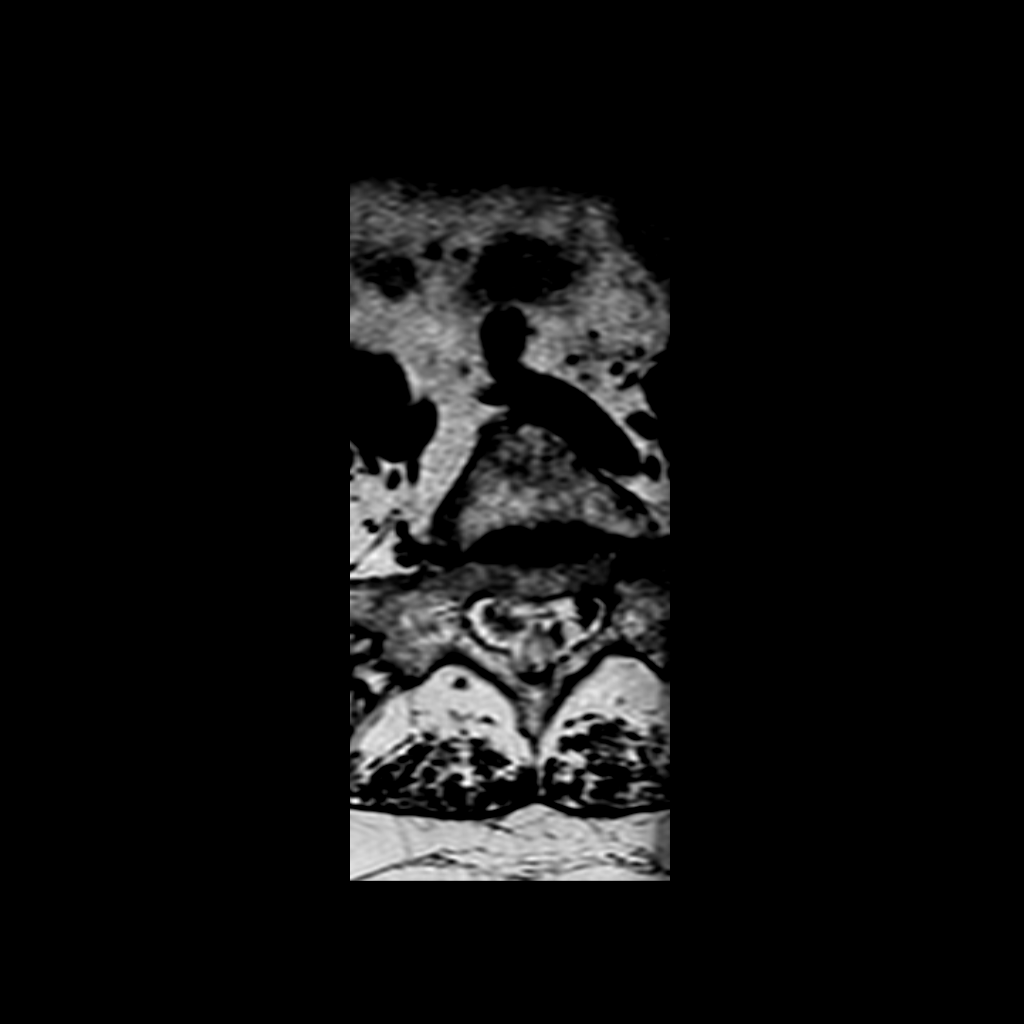
[im 48/151]
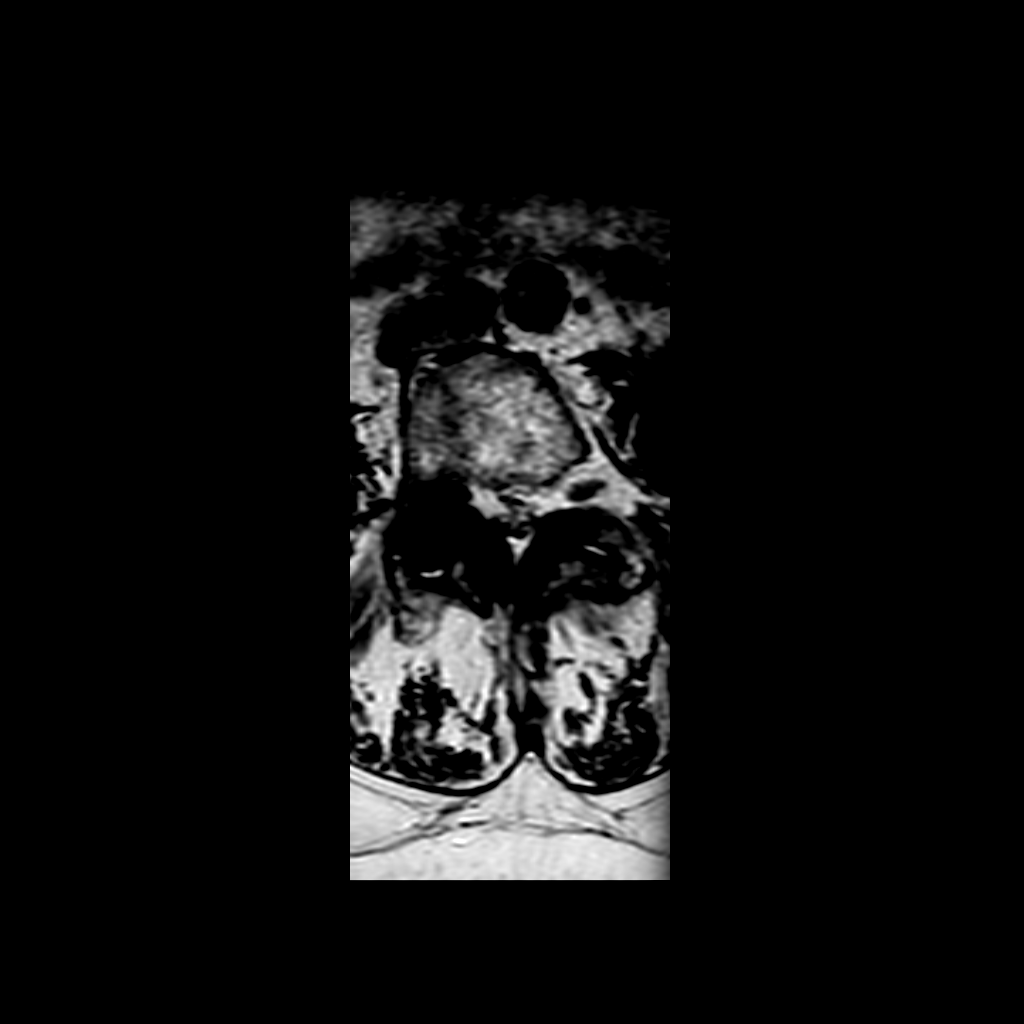

[12 of 48 positions shown; findings below may reference images not displayed]

FINDINGS: Todays study is mildly limited by motion artifact. 
There is moderate lumbar dextroscoliosis. There is 2 mm anterolisthesis at L3-4 
and L4-5. Mild Modic type I change at L4-5. There is moderate to marked disc 
narrowing on the left at L2-3, also at L5-S1. The conus terminates opposite T12. 
No evidence for spinal malignancy. Large hemangioma at L1. SI joint degenerative 
changes, not completely evaluated. 
At L5-S1 the canal is open. Mild right foraminal stenosis. Moderate facet 
degenerative change. 
At L4-5 there is moderate canal stenosis. Moderate facet change with ligamentous 
thickening and broad-based disc bulge. Marked right foraminal stenosis, minimal 
on the left. 
At L3-4 there is mild to moderate canal stenosis. There is marked left, mild 
right foraminal stenosis. 
At L2-3 there is mild canal stenosis, mild left foraminal stenosis, right 
foramen open. 
At L1-2 there is slight retrolisthesis. The canal is open. Mild left foraminal 
narrowing. 
At T12-L1 there is a right posterolateral disc bulge without significant 
stenosis. Mild right foraminal stenosis.
IMPRESSION: Multilevel degenerative changes associated with moderate lumbar dextroscoliosis. 
There is moderate canal stenosis at L4-5 with marked right foraminal 
impingement. 
Mild to moderate canal stenosis at L3-4 with marked left foraminal impingement. 
Less pronounced degenerative changes elsewhere as described. No evidence for 
compression fracture or spinal malignancy.

## 2022-04-06 IMAGING — MR MRI LEFT HIP WITHOUT CONTRAST
5 of 8 series · 16 of 40 positions shown · IV contrast (gadolinium)
Comparison: None.

________________________________________________________________________________________________ 
MRI LEFT HIP WITHOUT CONTRAST, 04/06/2022 [DATE]: 
CLINICAL INDICATION: Left hip abductor tendinitis. Other specified injury of 
muscle, fascia and tendon, left.
TECHNIQUE: Multiplanar, multiecho position MR images of the pelvis and left hip 
were performed without intravenous gadolinium enhancement. Small field-of-view 
imaging was performed of the hip.

[Series 101: survey_fullfov_transversal · axial · 10.0mm · 1.66mm/px · 1 of 7 slices shown]
[im 1/7]
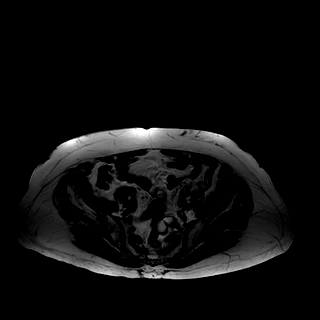

[Series 201: survey · axial · 15.0mm · 1.76mm/px · z∈[+20,+263]mm · 3 of 14 slices shown (1 of 2)]
[im 1/14]
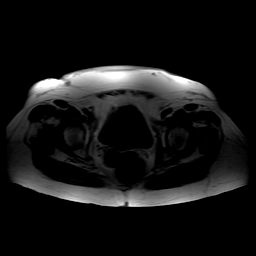
[im 7/14]
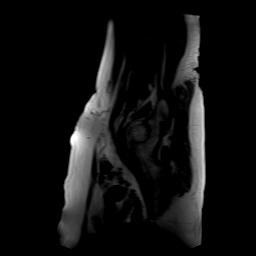
[im 14/14]
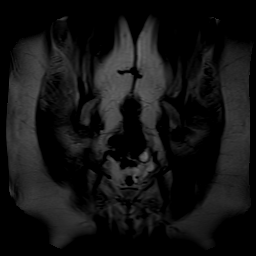

[Series 301: survey · axial · 15.0mm · 1.76mm/px · z∈[+20,+263]mm · 3 of 14 slices shown (2 of 2)]
[im 1/14]
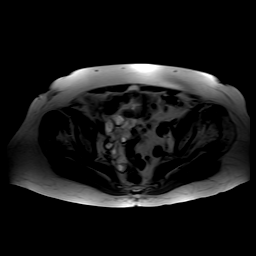
[im 7/14]
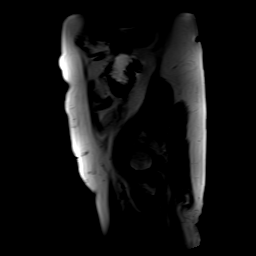
[im 14/14]
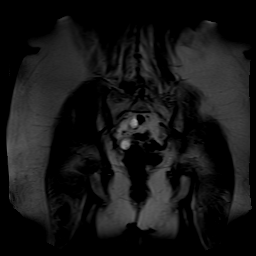

[Series 401: stir_cor-pelvis · coronal · 5.0mm · 0.66mm/px · 7 of 35 slices shown]
[im 1/35]
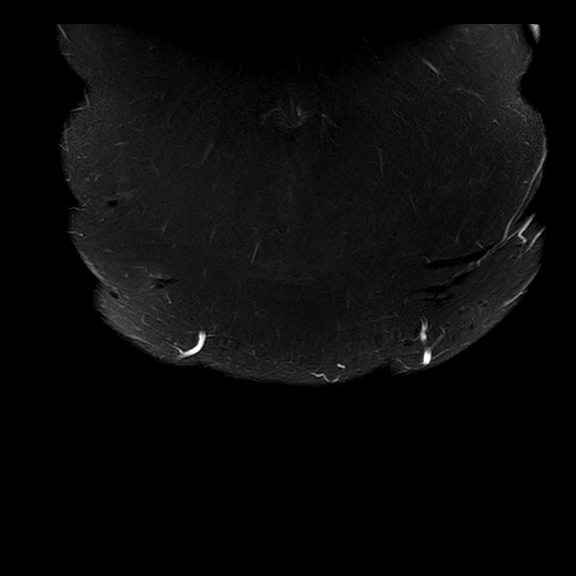
[im 6/35]
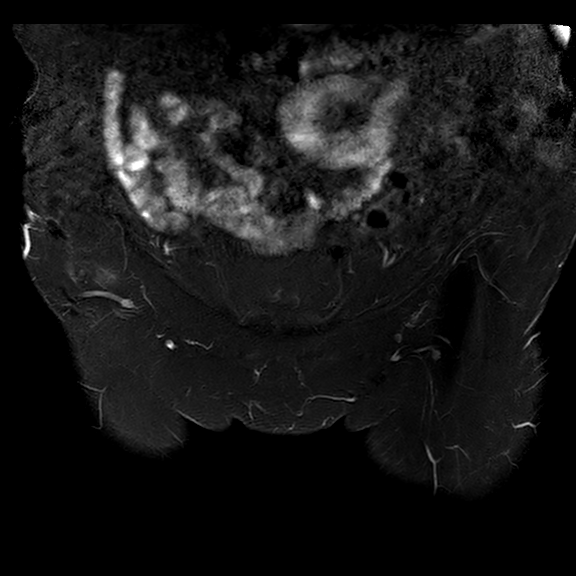
[im 12/35]
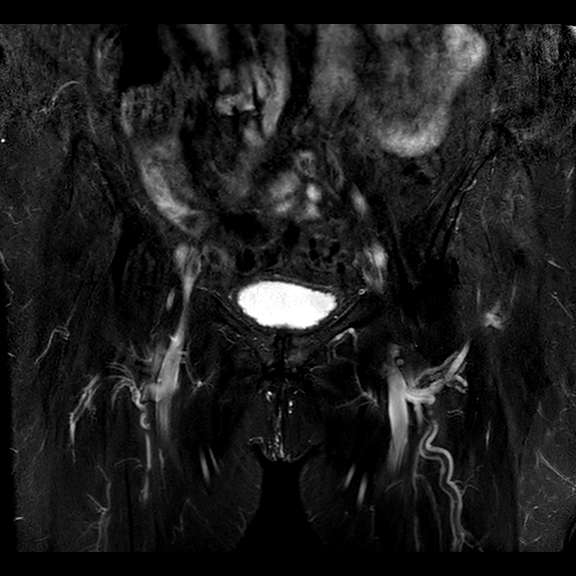
[im 18/35]
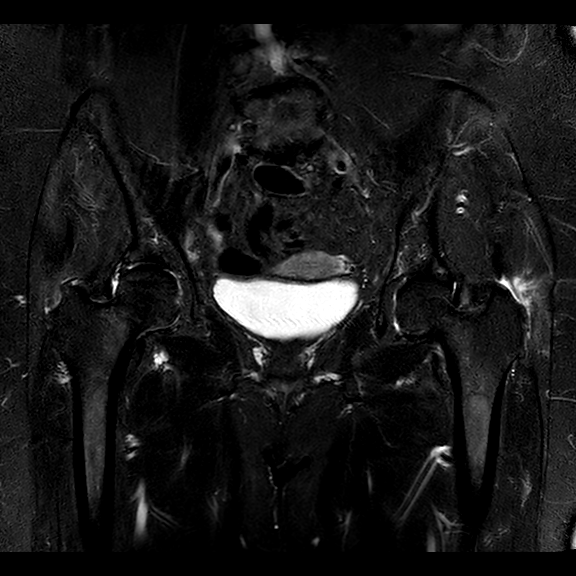
[im 23/35]
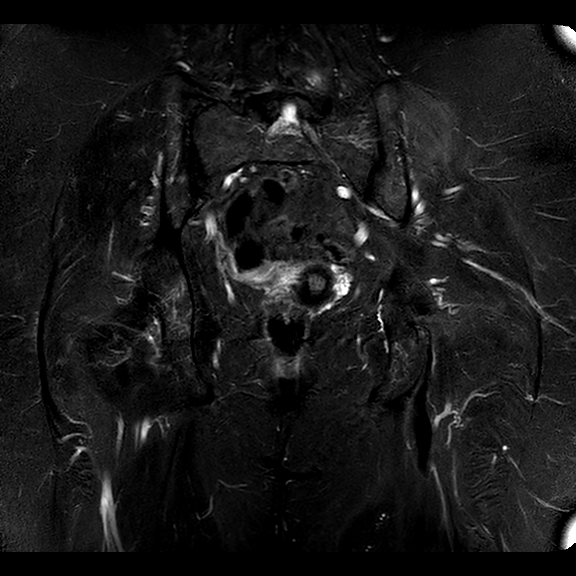
[im 29/35]
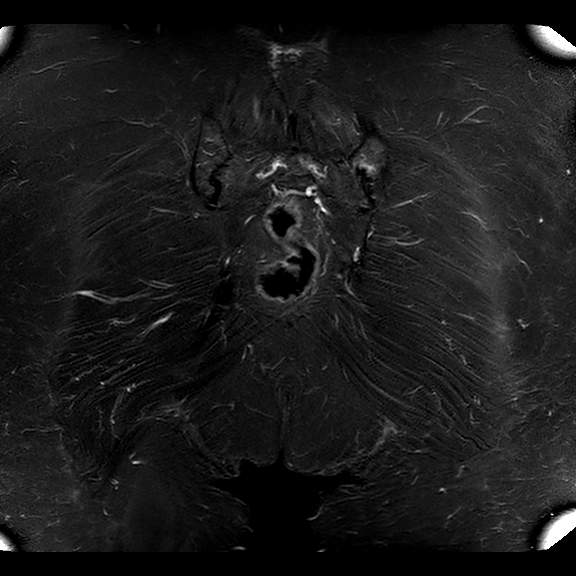
[im 35/35]
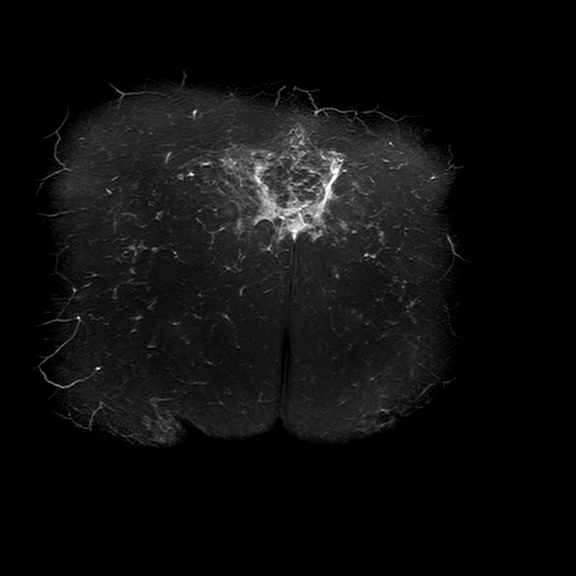

[Series 501: t1_(person_name) · axial · 5.0mm · 0.42mm/px · z∈[-124,-94]mm · 2 of 45 slices shown]
[im 1/45]
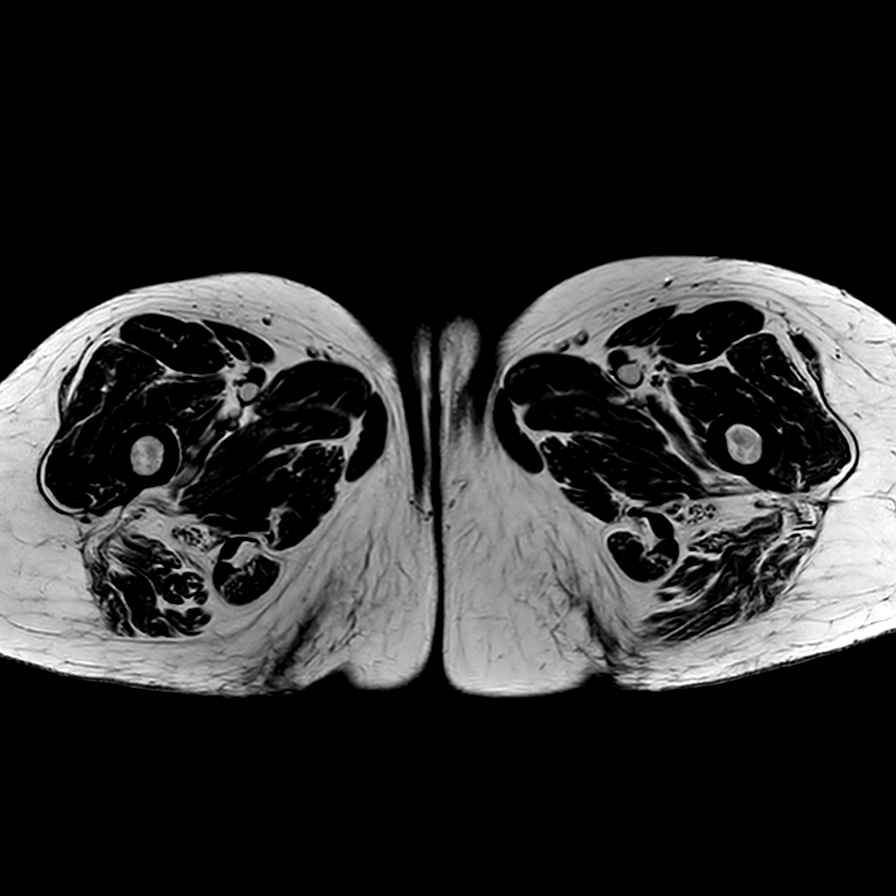
[im 6/45]
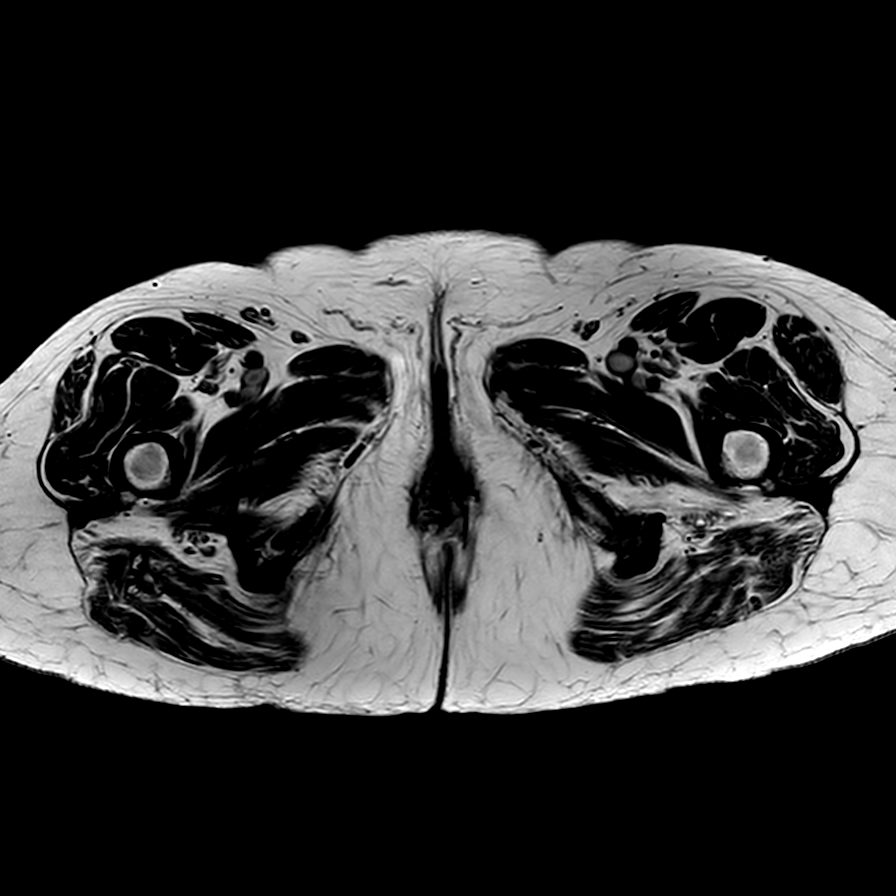

[16 of 40 positions shown; findings below may reference images not displayed]

FINDINGS: HIPS: Moderate degenerative change of the hips with partial-thickness and 
full-thickness chondral malacia, small subcortical cysts and degenerative labral 
tears. No paralabral cyst. No hip joint effusion. Both femoral heads maintain a 
spherical configuration without evidence of avascular necrosis or subarticular 
collapse. No abnormal morphology of the proximal femurs or acetabulum to 
predispose to impingement. Bilateral femoral greater trochanteric enthesopathy. 
PELVIC BONES: Normal marrow signal intensity. No fracture, contusion or marrow 
replacing lesion.  
SI JOINTS: SI joints are preserved.  
PUBIC SYMPHYSIS: Moderate degenerative change. 
SPINE: Multilevel degenerative change of the spine and 21 degrees 
dextroscoliosis. 
SOFT TISSUES: Full-thickness tear of the lateral attachment of the distal left 
gluteus medius measures 2.2 cm in AP dimensions with up to 1.6 cm retraction of 
the tendinous remnant. Fluid within the gap. Partial thickness tears of the 
right peroneal tendons, bilateral gluteal tendinosis and small amount of right 
peritendinous/peritrochanteric fluid. Small low-grade partial thickness tears of 
the hamstring tendon origins. The rectus abdominis-adductor aponeurotic 
complexes are intact. No mass, free fluid or adenopathy. Colonic diverticulosis.
IMPRESSION: 1.  2.2 cm full-thickness tear of the lateral attachment of the distal left 
gluteus medius, gluteal tendinosis and adjacent fluid.  
2.  Partial thickness tears of the right peroneal tendons, bilateral gluteal 
tendinosis and small amount of right peritendinous/peritrochanteric fluid.  
3.  Moderate degenerative change of the hips and degenerative labral tears.  
4.  Bilateral femoral greater trochanteric enthesopathy. 
5.  Small low-grade partial thickness tears of the hamstring tendon origins.  
6.  Degenerative change of the pubic symphysis/SI joints/spine and mild 
dextroscoliosis. 
7.  Colonic diverticulosis.

## 2022-12-12 IMAGING — MG MAMMOGRAPHY SCREENING BILATERAL 3[PERSON_NAME]
8 series · 8 of 24 positions shown · non-contrast
Comparison: 12/23/2021 and exams dating back to 11/11/2018.

________________________________________________________________________________________________ 
MAMMOGRAPHY SCREENING BILATERAL 3LEIGHTON DOMINGUES, 12/12/2022 [DATE]: 
CLINICAL INDICATION: Encounter for screening mammogram. Prior negative right 
breast biopsy. Patient was involved in a clinical trial years ago with 
injections in right breast.
TECHNIQUE: Digital bilateral mammograms and 3-D Tomosynthesis were obtained. 
These were interpreted both primarily and with the aid of computer-aided 
detection system.  
BREAST DENSITY: (Level B) There are scattered areas of fibroglandular density.

[R MLO]
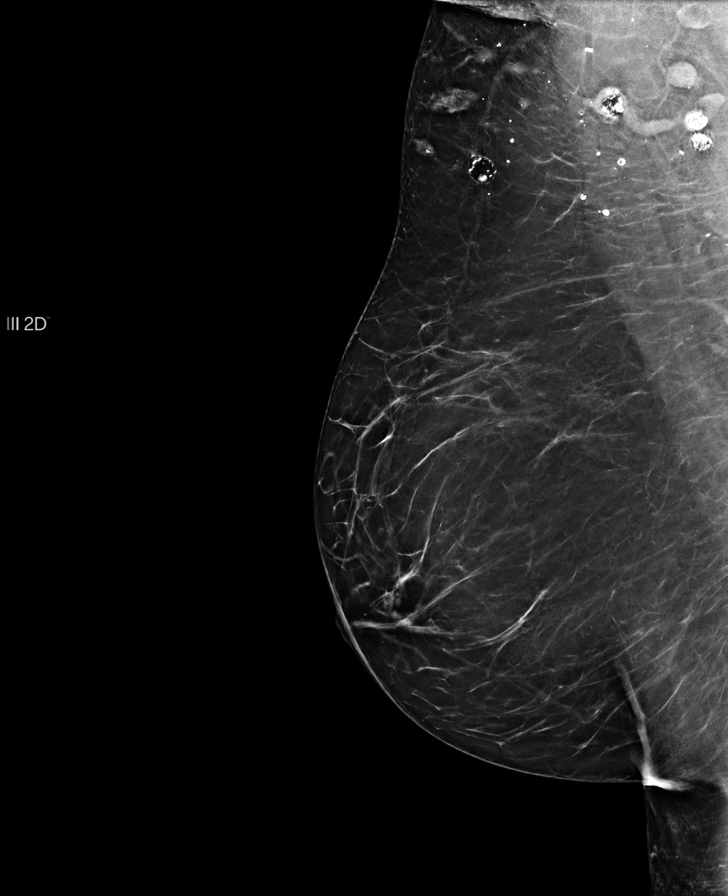

[L MLO]
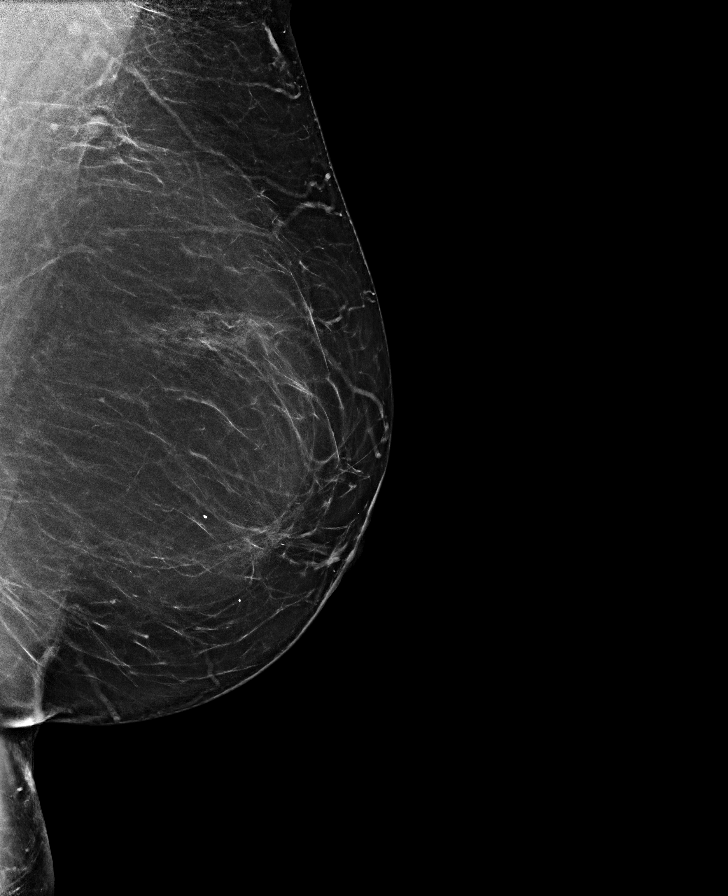

[R CC]
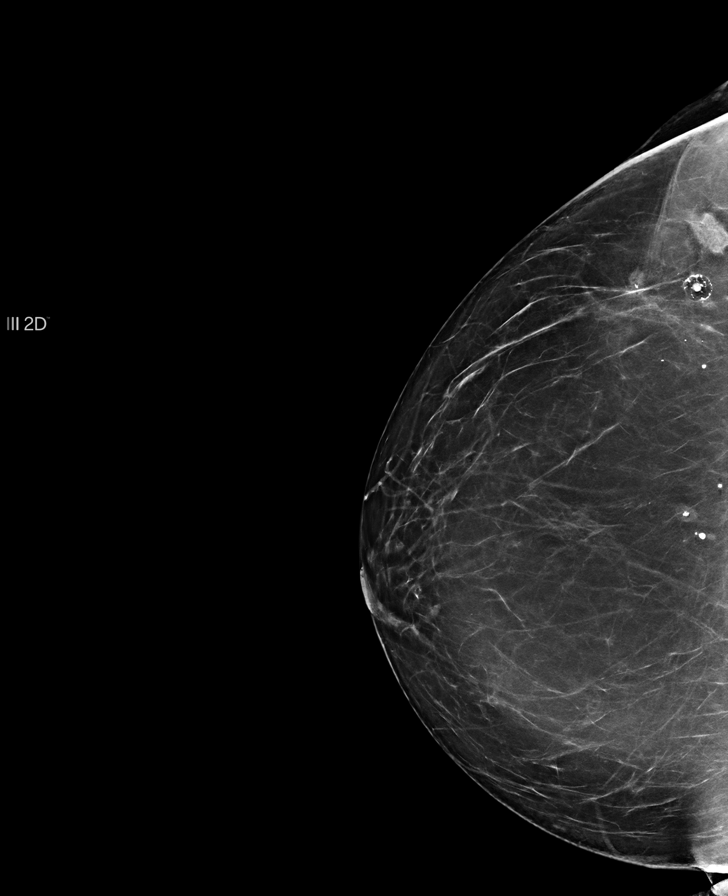

[L CC]
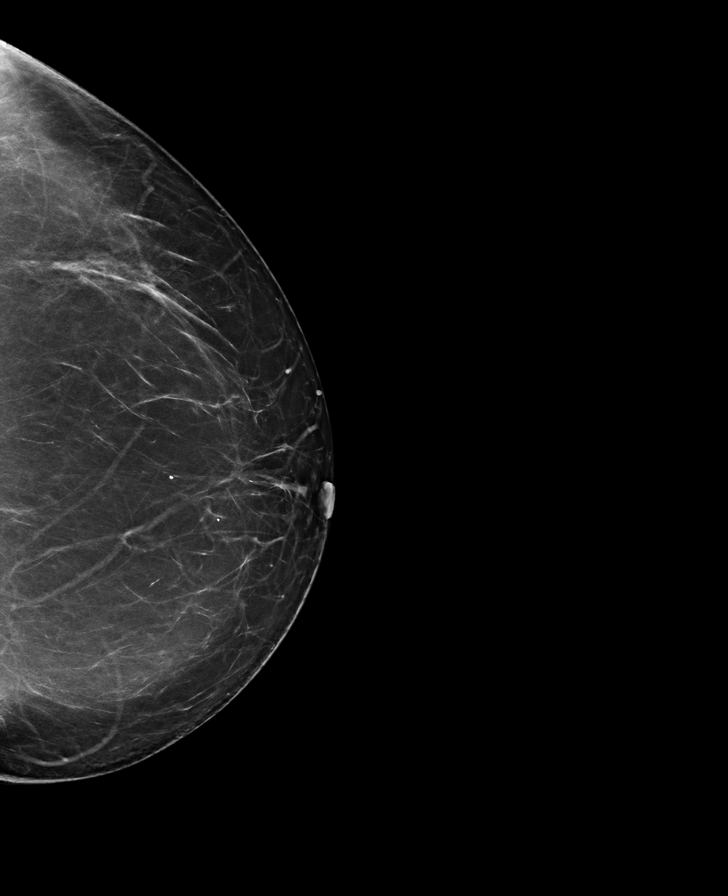

[L MLO tomo · tomo slice 15/29.0]
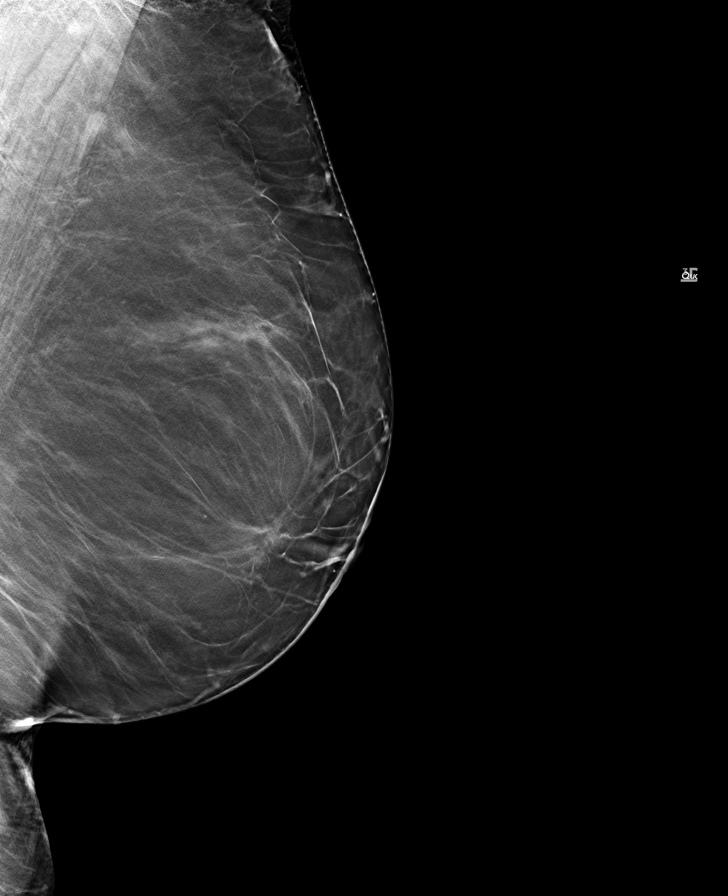

[R CC tomo · tomo slice 15/29.0]
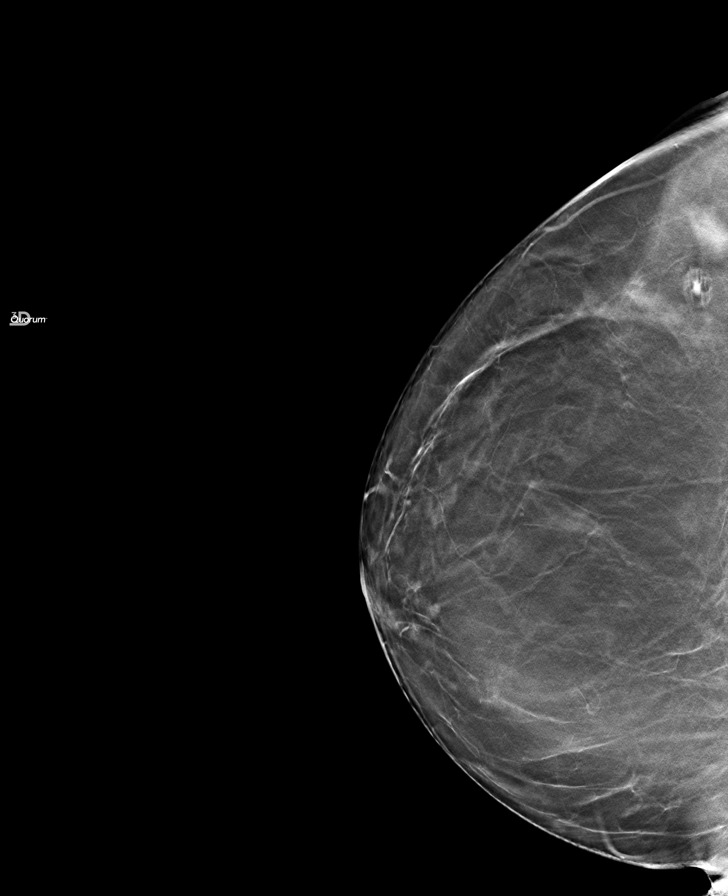

[L CC tomo · tomo slice 15/28.0]
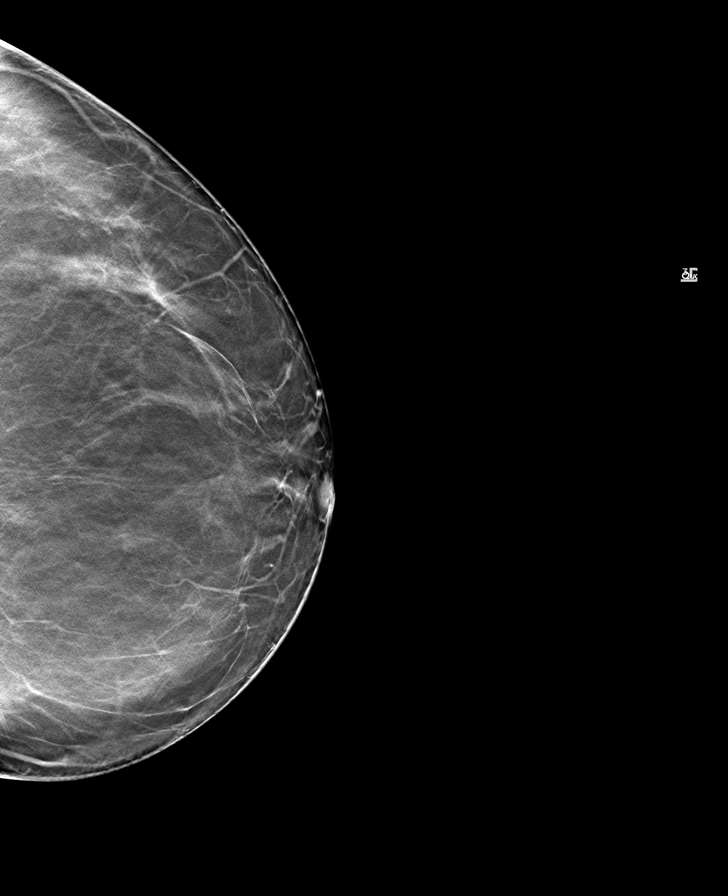

[R MLO tomo · tomo slice 15/30.0]
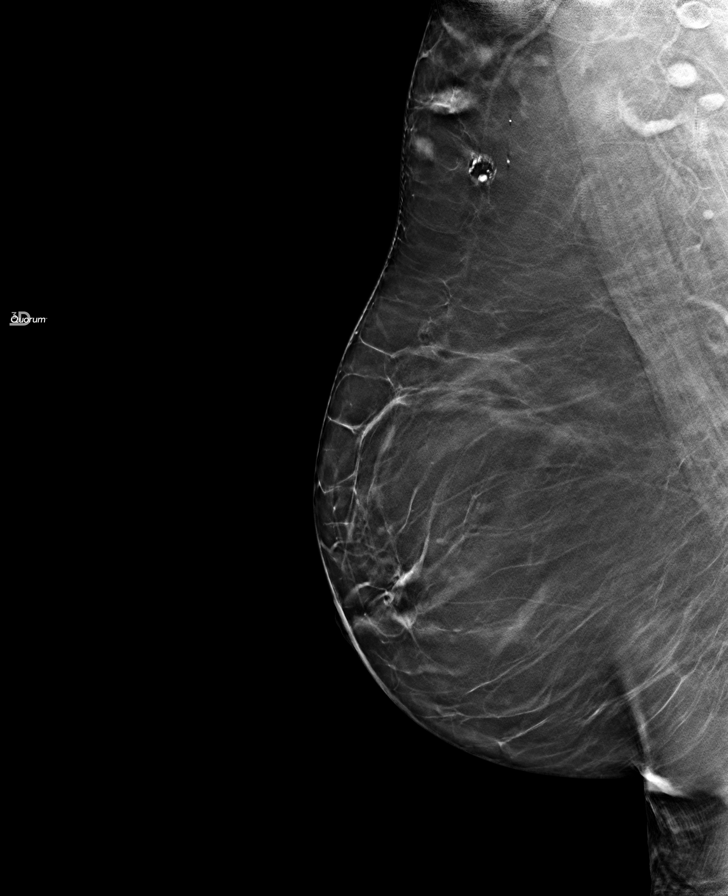

[8 of 24 positions shown; findings below may reference images not displayed]

FINDINGS: No suspicious mass, calcifications, or area of architectural 
distortion in either breast. High-density material within right axillary nodes 
is again seen, unchanged and correlates with patients history. Overall stable 
mammographic appearance.
IMPRESSION: No mammographic findings suggestive for malignancy. 
(BI-RADS 2) Benign findings. Routine mammographic follow-up is recommended.

## 2023-03-03 IMAGING — MR MRI PELVIS WITHOUT CONTRAST
4 of 6 series · 10 of 48 positions shown · IV contrast (gadolinium)
Comparison: 06/27/2022 MRI

________________________________________________________________________________________________ 
MRI PELVIS WITHOUT CONTRAST, 03/03/2023 [DATE]: 
CLINICAL INDICATION: Recent left gluteus medius and minimus tendon repair on 
11/22/2022. Now with SI joint pain. No relief from steroid injection. 
Sacroiliitis, Not Elsewhere Classified.
TECHNIQUE: Multiplanar, multiecho position MR images of the pelvis were 
performed without intravenous gadolinium enhancement.

[Series 201: survey · axial · 10.0mm · 1.25mm/px · z∈[-34,+200]mm · 2 of 11 slices shown]
[im 1/11]
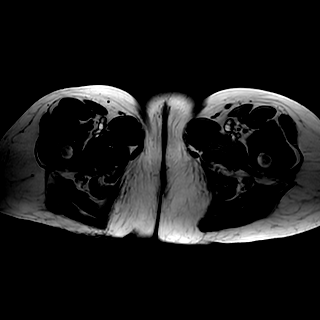
[im 11/11]
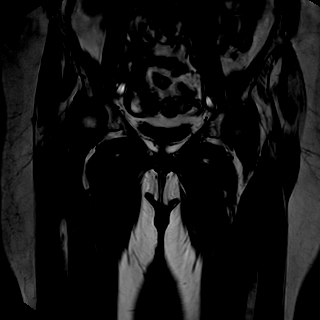

[Series 301: t1_(person_name) · axial · 6.0mm · 0.42mm/px · z∈[-60,+148]mm · 3 of 36 slices shown]
[im 5/36]
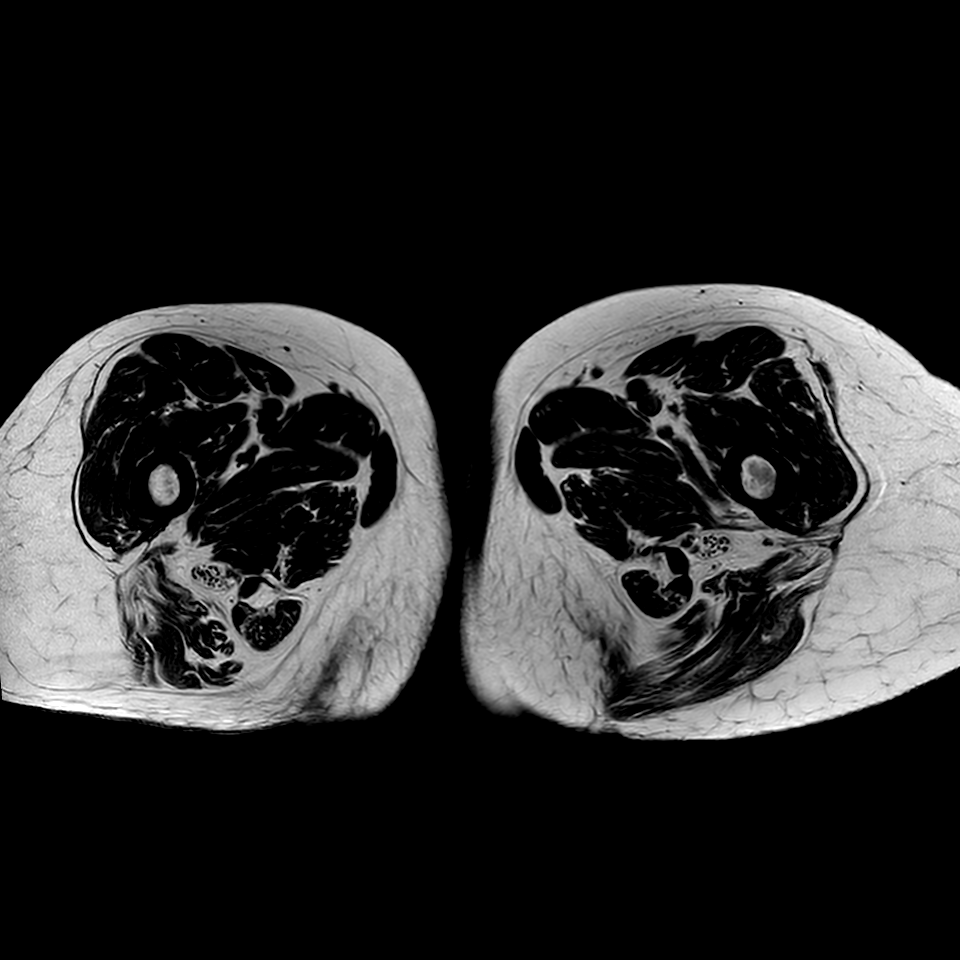
[im 18/36]
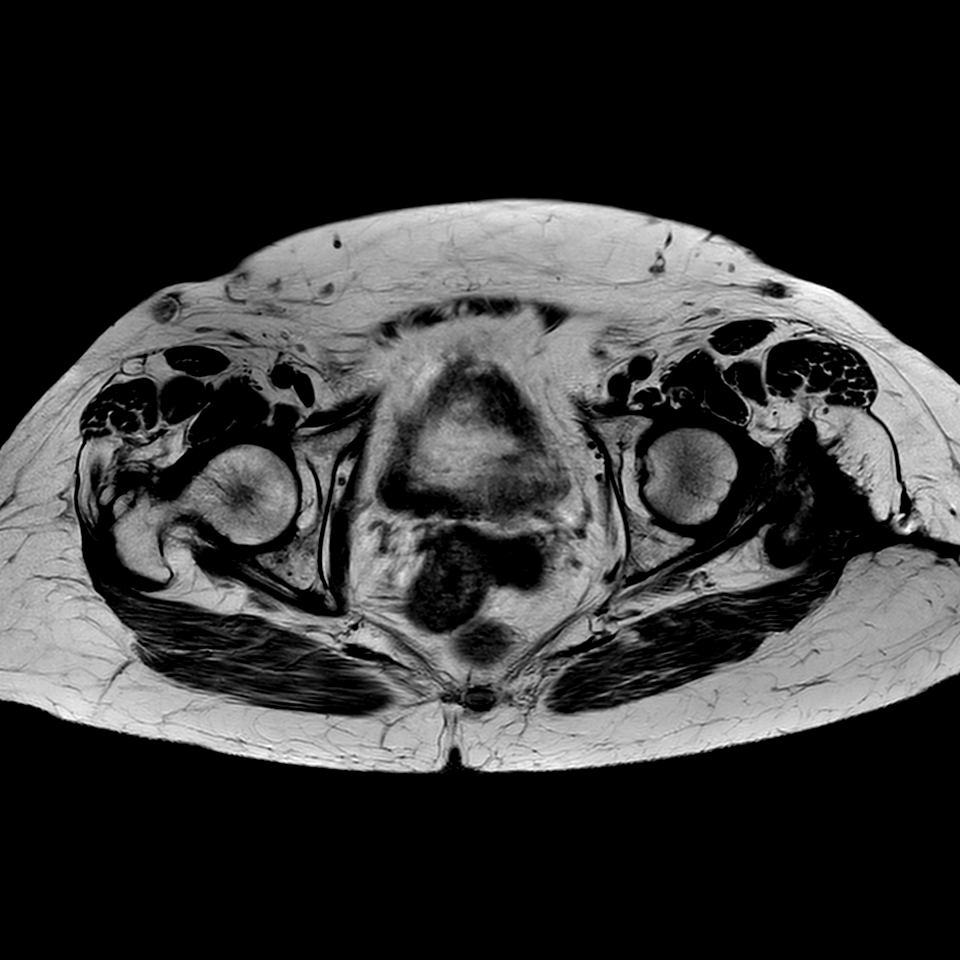
[im 31/36]
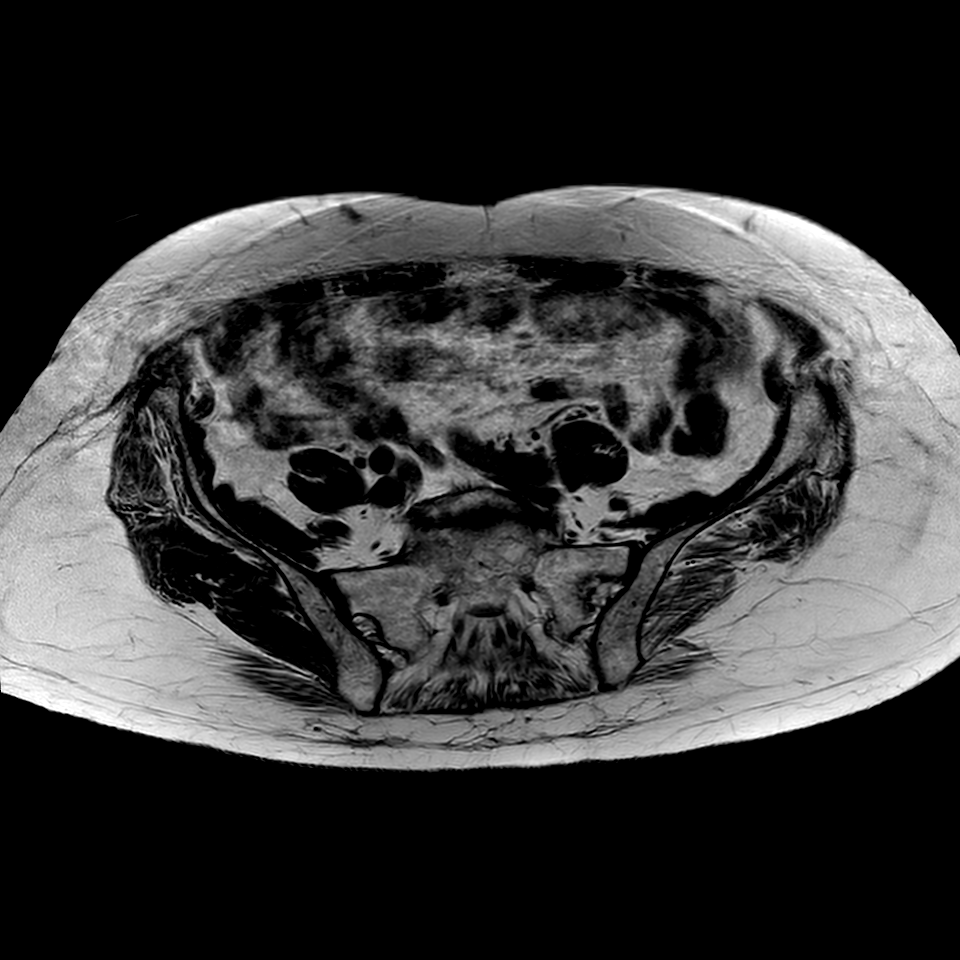

[Series 401: (person_name)_(person_name)_(person_name) · axial · 6.0mm · 0.62mm/px · z∈[-60,+148]mm · 3 of 36 slices shown]
[im 5/36]
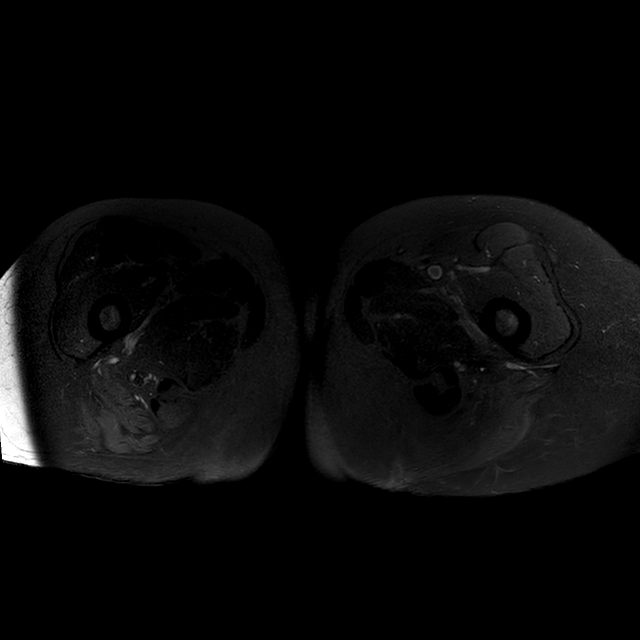
[im 18/36]
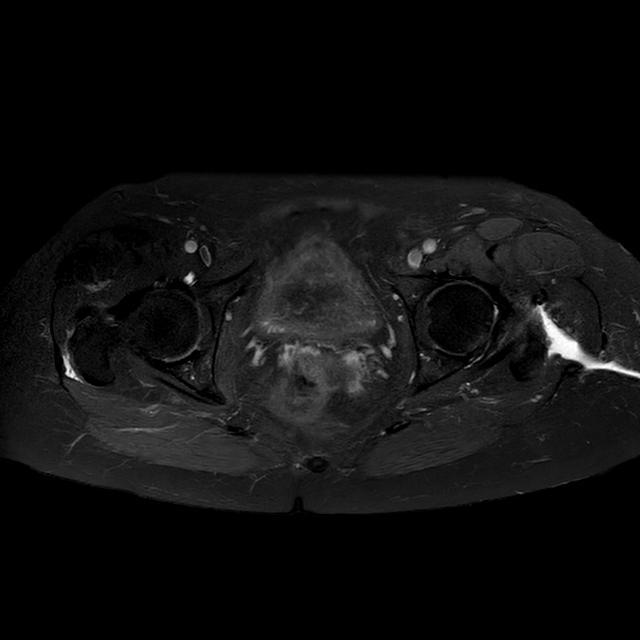
[im 31/36]
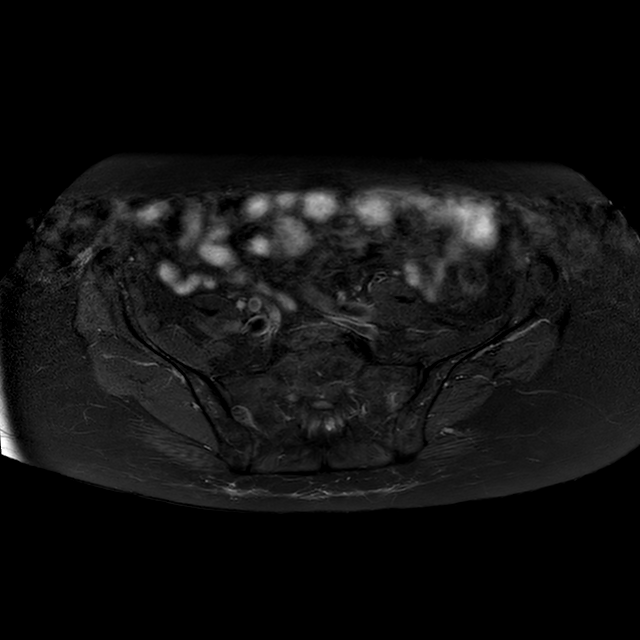

[Series 501: t1_cor · coronal · 5.0mm · 0.60mm/px · 2 of 30 slices shown]
[im 5/30]
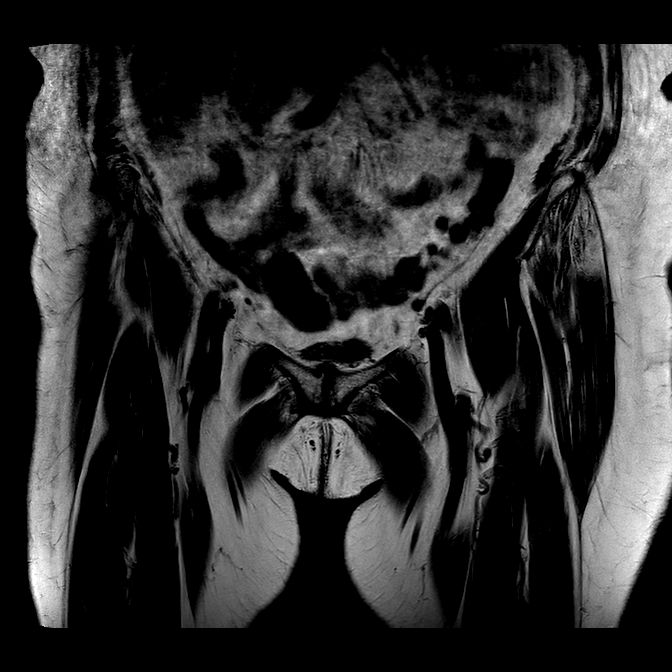
[im 15/30]
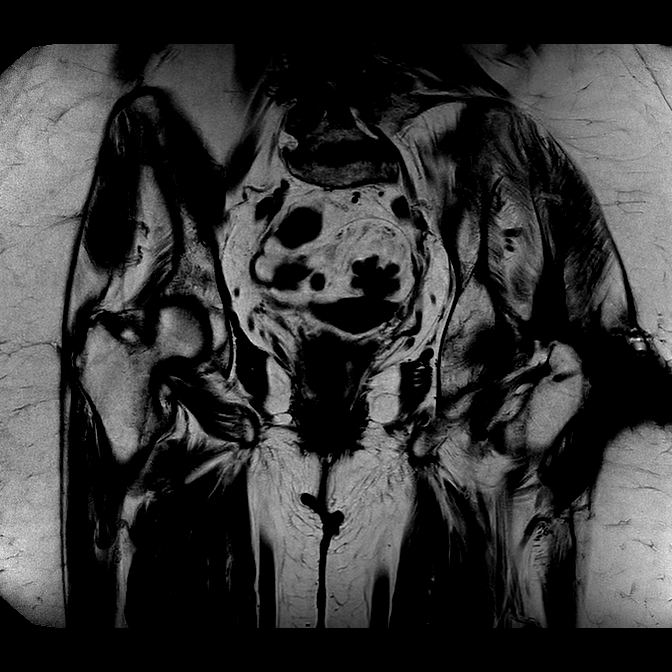

[10 of 48 positions shown; findings below may reference images not displayed]

FINDINGS: SI JOINTS: No sacroiliitis. Joint spaces are preserved. 
GLUTEAL TENDONS/FEMURS: Interval left gluteal tendon repair with bioabsorbable 
suture anchors in the left femoral greater trochanter. Full-thickness re-tear of 
the lateral attachment of the left gluteus medius measures 2.9 cm in AP 
dimensions with 1.7 cm proximal retraction of the tendinous remnant. 
Incompletely imaged fluid (at least 5.0 x 3.0 x 7.3 cm) extends from the left 
femoral greater trochanter into the subcutaneous soft tissues through the 
incisional defect. 1.2 cm gap between the transected left tensor fascia lata 
margins: The defect measures 7.3 cm in length. Multifocal fatty muscular 
atrophy, most marked involving the left gluteus minimus and anterior medius 
muscles. Partial thickness tears and tendinosis of the right gluteus 
medius/minimus tendons with small amount of peritendinous/peritrochanteric 
fluid.   
HIPS: Moderate degenerative change of the hips with partial-thickness and 
full-thickness chondromalacia, small subcortical cysts and degenerative labral 
tears. No paralabral cyst. No hip joint effusion. Both femoral heads maintain a 
spherical configuration without evidence of avascular necrosis or subarticular 
collapse. No abnormal morphology of the proximal femurs or acetabulum to 
predispose to impingement. Bilateral femoral greater trochanteric enthesopathy. 
PELVIC BONES: Normal marrow signal intensity. No fracture, contusion or marrow 
replacing lesion.  
PUBIC SYMPHYSIS: Moderate degenerative change. 
SPINE: Multilevel degenerative change of the spine and mild dextroscoliosis. 
OTHER SOFT TISSUES: Tiny low-grade partial thickness tears of the hamstring 
tendon origins. The rectus abdominis-adductor aponeurotic complexes are intact. 
No mass, free fluid or adenopathy. Colonic diverticulosis. The bladder and 
pelvic organs are unremarkable.
IMPRESSION: 1.  No sacroiliitis.  
2.  Interval left gluteal tendon repair with full-thickness re-tear of the 
lateral attachment of the left gluteus medius and fluid collection extending 
through the incisional defect (at least 5.0 x 3.0 x 7.3 cm). 
3.  Stable multiple additional findings.

## 2023-05-15 IMAGING — DX CHEST PA AND LATERAL
2 series · 2 of 2 positions shown · non-contrast
Comparison: None.

________________________________________________________________________________________________ 
CLINICAL INDICATION: Acute Bronchitis, Unspecified.

[PA]
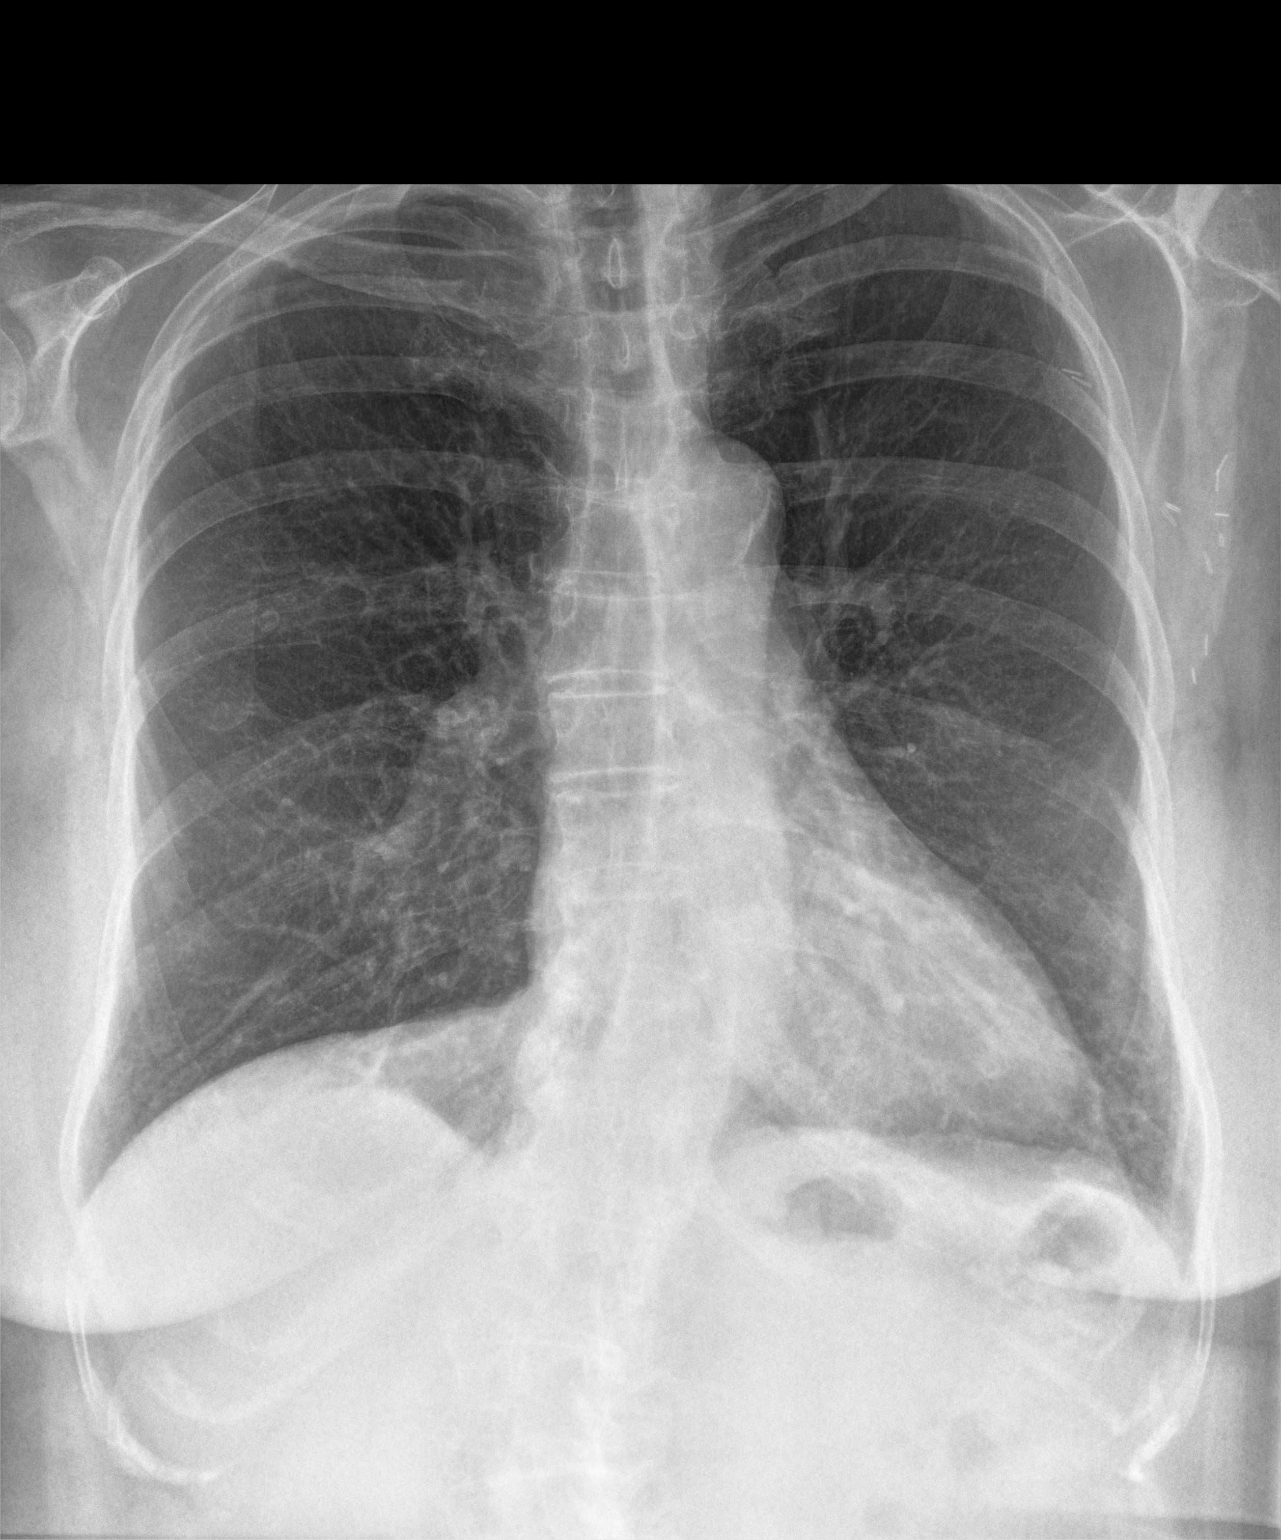

[lateral]
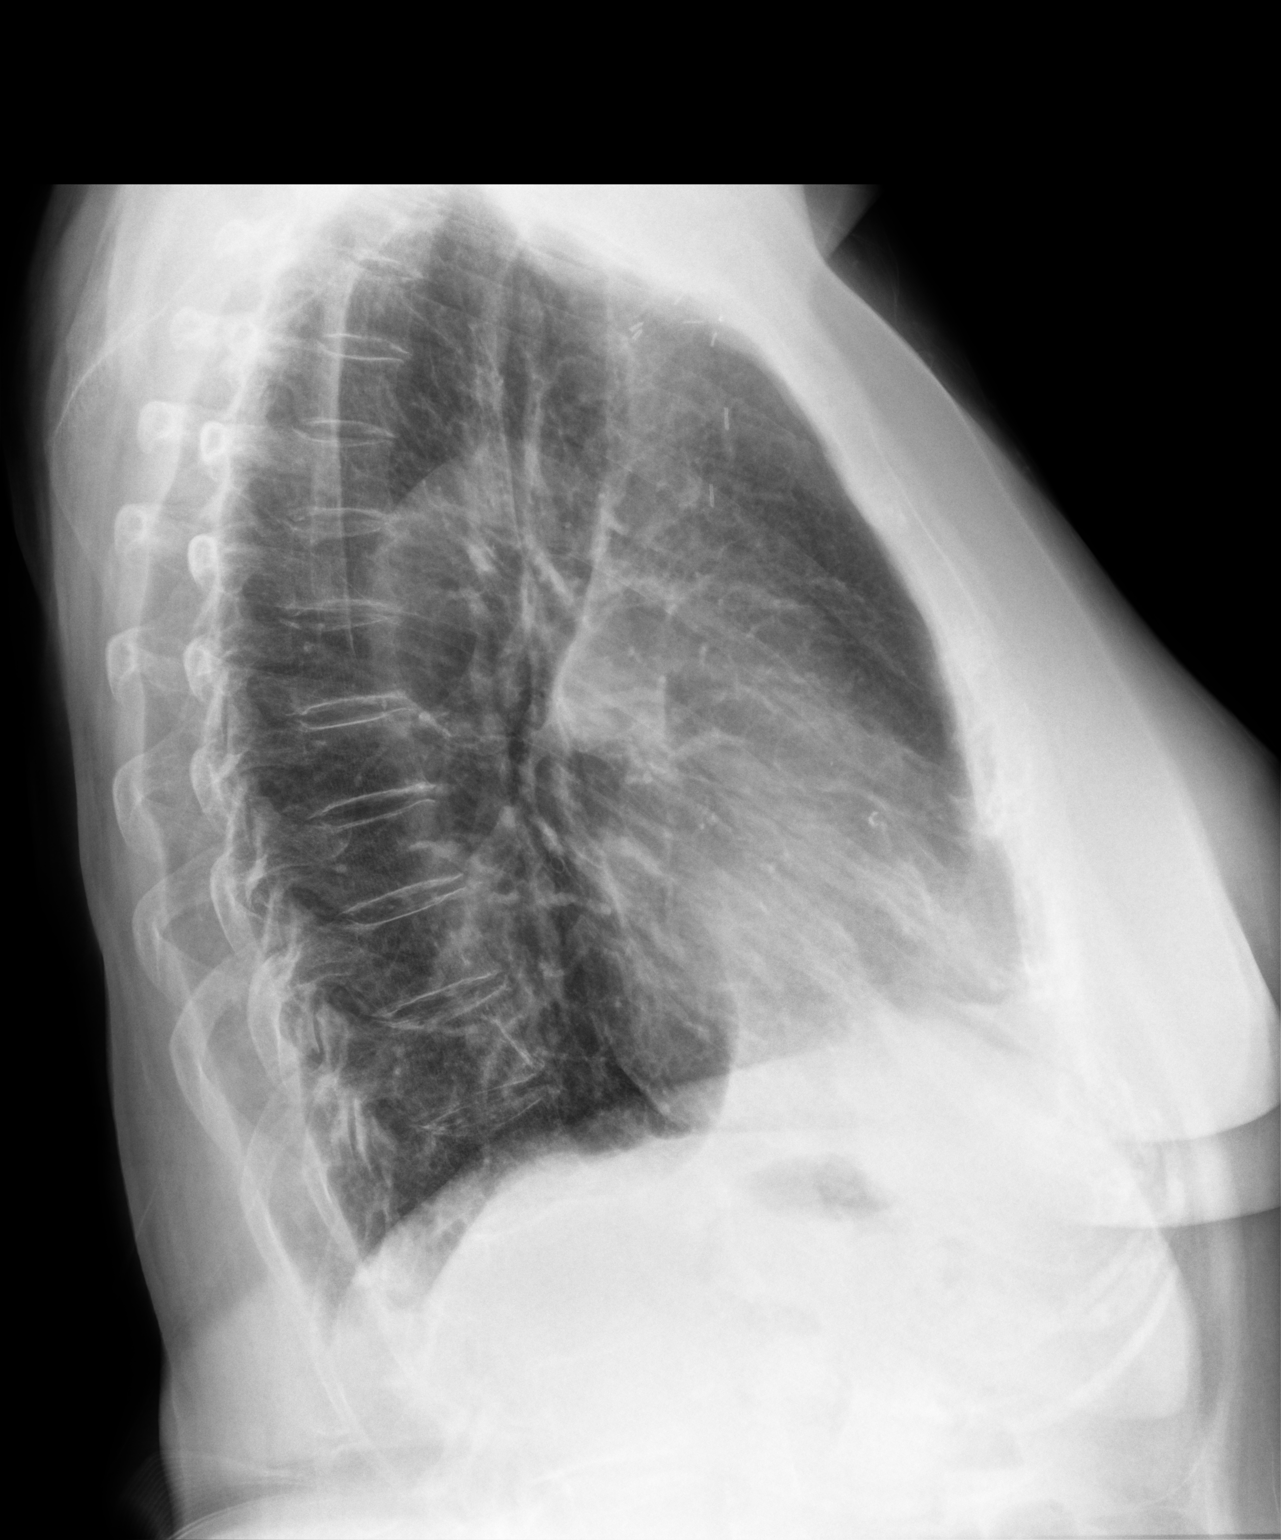

[2 of 2 positions shown; findings below may reference images not displayed]

FINDINGS: Postop changes left axilla. Atherosclerotic changes thoracic arch. No 
mass or consolidation. No pleural effusion identified.
IMPRESSION: No acute cardiopulmonary findings.
